# Patient Record
Sex: Male | Born: 1980
Health system: Southern US, Community
[De-identification: ages and names within clinical notes are randomized; demographics above are authoritative.]

## PROBLEM LIST (undated history)

## (undated) DIAGNOSIS — I10 Essential (primary) hypertension: Secondary | ICD-10-CM

## (undated) DIAGNOSIS — E785 Hyperlipidemia, unspecified: Secondary | ICD-10-CM

## (undated) HISTORY — PX: KNEE ARTHROSCOPY: SUR90

## (undated) HISTORY — DX: Essential (primary) hypertension: I10

## (undated) HISTORY — PX: KNEE SURGERY: SHX244

## (undated) HISTORY — DX: Hyperlipidemia, unspecified: E78.5

---

## 2020-01-16 DIAGNOSIS — E119 Type 2 diabetes mellitus without complications: Secondary | ICD-10-CM

## 2020-01-16 HISTORY — DX: Type 2 diabetes mellitus without complications: E11.9

## 2020-01-17 ENCOUNTER — Encounter (HOSPITAL_COMMUNITY): Payer: Self-pay

## 2020-01-17 ENCOUNTER — Other Ambulatory Visit: Payer: Self-pay

## 2020-01-17 ENCOUNTER — Inpatient Hospital Stay (HOSPITAL_COMMUNITY)
Admission: EM | Admit: 2020-01-17 | Discharge: 2020-01-20 | DRG: 638 | Disposition: A | Discharge status: Home / Self Care | Payer: Self-pay | Attending: Internal Medicine | Admitting: Internal Medicine

## 2020-01-17 ENCOUNTER — Ambulatory Visit (HOSPITAL_COMMUNITY)
Admission: EM | Admit: 2020-01-17 | Discharge: 2020-01-17 | Payer: Self-pay | Attending: Family Medicine | Admitting: Family Medicine

## 2020-01-17 ENCOUNTER — Encounter (HOSPITAL_COMMUNITY): Payer: Self-pay | Admitting: Emergency Medicine

## 2020-01-17 DIAGNOSIS — Z833 Family history of diabetes mellitus: Secondary | ICD-10-CM

## 2020-01-17 DIAGNOSIS — E871 Hypo-osmolality and hyponatremia: Secondary | ICD-10-CM | POA: Diagnosis present

## 2020-01-17 DIAGNOSIS — Z79899 Other long term (current) drug therapy: Secondary | ICD-10-CM

## 2020-01-17 DIAGNOSIS — E1110 Type 2 diabetes mellitus with ketoacidosis without coma: Principal | ICD-10-CM | POA: Diagnosis present

## 2020-01-17 DIAGNOSIS — Z8249 Family history of ischemic heart disease and other diseases of the circulatory system: Secondary | ICD-10-CM

## 2020-01-17 DIAGNOSIS — E861 Hypovolemia: Secondary | ICD-10-CM | POA: Diagnosis present

## 2020-01-17 DIAGNOSIS — Z6826 Body mass index (BMI) 26.0-26.9, adult: Secondary | ICD-10-CM

## 2020-01-17 DIAGNOSIS — E875 Hyperkalemia: Secondary | ICD-10-CM | POA: Diagnosis present

## 2020-01-17 DIAGNOSIS — R739 Hyperglycemia, unspecified: Secondary | ICD-10-CM

## 2020-01-17 DIAGNOSIS — R634 Abnormal weight loss: Secondary | ICD-10-CM | POA: Diagnosis present

## 2020-01-17 DIAGNOSIS — E876 Hypokalemia: Secondary | ICD-10-CM | POA: Diagnosis present

## 2020-01-17 DIAGNOSIS — N179 Acute kidney failure, unspecified: Secondary | ICD-10-CM | POA: Diagnosis present

## 2020-01-17 DIAGNOSIS — Z20822 Contact with and (suspected) exposure to covid-19: Secondary | ICD-10-CM | POA: Diagnosis present

## 2020-01-17 DIAGNOSIS — D696 Thrombocytopenia, unspecified: Secondary | ICD-10-CM | POA: Diagnosis present

## 2020-01-17 LAB — BASIC METABOLIC PANEL
Anion gap: 18 — ABNORMAL HIGH (ref 5–15)
Anion gap: 18 — ABNORMAL HIGH (ref 5–15)
Anion gap: 14 (ref 5–15)
BUN: 20 mg/dL (ref 6–20)
BUN: 19 mg/dL (ref 6–20)
BUN: 14 mg/dL (ref 6–20)
CO2: 17 mmol/L — ABNORMAL LOW (ref 22–32)
CO2: 18 mmol/L — ABNORMAL LOW (ref 22–32)
CO2: 21 mmol/L — ABNORMAL LOW (ref 22–32)
Calcium: 8.9 mg/dL (ref 8.9–10.3)
Calcium: 8.9 mg/dL (ref 8.9–10.3)
Calcium: 9.6 mg/dL (ref 8.9–10.3)
Chloride: 92 mmol/L — ABNORMAL LOW (ref 98–111)
Chloride: 96 mmol/L — ABNORMAL LOW (ref 98–111)
Chloride: 100 mmol/L (ref 98–111)
Creatinine, Ser: 1.3 mg/dL — ABNORMAL HIGH (ref 0.61–1.24)
Creatinine, Ser: 1.28 mg/dL — ABNORMAL HIGH (ref 0.61–1.24)
Creatinine, Ser: 1.13 mg/dL (ref 0.61–1.24)
GFR calc Af Amer: >60 mL/min (ref >60)
GFR calc Af Amer: >60 mL/min (ref >60)
GFR calc Af Amer: >60 mL/min (ref >60)
GFR calc non Af Amer: >60 mL/min (ref >60)
GFR calc non Af Amer: >60 mL/min (ref >60)
GFR calc non Af Amer: >60 mL/min (ref >60)
Glucose, Bld: 781 mg/dL (ref 70–99)
Glucose, Bld: 534 mg/dL (ref 70–99)
Glucose, Bld: 157 mg/dL — ABNORMAL HIGH (ref 70–99)
Potassium: 5.6 mmol/L — ABNORMAL HIGH (ref 3.5–5.1)
Potassium: 4.5 mmol/L (ref 3.5–5.1)
Potassium: 5.2 mmol/L — ABNORMAL HIGH (ref 3.5–5.1)
Sodium: 127 mmol/L — ABNORMAL LOW (ref 135–145)
Sodium: 132 mmol/L — ABNORMAL LOW (ref 135–145)
Sodium: 135 mmol/L (ref 135–145)

## 2020-01-17 LAB — POCT I-STAT EG7
Acid-base deficit: 4 mmol/L — ABNORMAL HIGH (ref 0.0–2.0)
Bicarbonate: 22.2 mmol/L (ref 20.0–28.0)
Calcium, Ion: 1.16 mmol/L (ref 1.15–1.40)
HCT: 37 % — ABNORMAL LOW (ref 39.0–52.0)
Hemoglobin: 12.6 g/dL — ABNORMAL LOW (ref 13.0–17.0)
O2 Saturation: 79 %
Potassium: 4.7 mmol/L (ref 3.5–5.1)
Sodium: 128 mmol/L — ABNORMAL LOW (ref 135–145)
TCO2: 23 mmol/L (ref 22–32)
pCO2, Ven: 41.6 mmHg — ABNORMAL LOW (ref 44.0–60.0)
pH, Ven: 7.335 (ref 7.250–7.430)
pO2, Ven: 46 mmHg — ABNORMAL HIGH (ref 32.0–45.0)

## 2020-01-17 LAB — URINALYSIS, ROUTINE W REFLEX MICROSCOPIC
Bacteria, UA: NONE SEEN
Bilirubin Urine: NEGATIVE
Glucose, UA: >=500 mg/dL — AB
Hgb urine dipstick: NEGATIVE
Ketones, ur: 20 mg/dL — AB
Leukocytes,Ua: NEGATIVE
Nitrite: NEGATIVE
Protein, ur: NEGATIVE mg/dL
Specific Gravity, Urine: 1.026 (ref 1.005–1.030)
pH: 6 (ref 5.0–8.0)

## 2020-01-17 LAB — CBC
HCT: 38.9 % — ABNORMAL LOW (ref 39.0–52.0)
Hemoglobin: 13.6 g/dL (ref 13.0–17.0)
MCH: 31.5 pg (ref 26.0–34.0)
MCHC: 35 g/dL (ref 30.0–36.0)
MCV: 90 fL (ref 80.0–100.0)
Platelets: 148 10*3/uL — ABNORMAL LOW (ref 150–400)
RBC: 4.32 MIL/uL (ref 4.22–5.81)
RDW: 13.1 % (ref 11.5–15.5)
WBC: 5.3 10*3/uL (ref 4.0–10.5)
nRBC: 0 % (ref 0.0–0.2)

## 2020-01-17 LAB — GLUCOSE, CAPILLARY
Glucose-Capillary: 249 mg/dL — ABNORMAL HIGH (ref 70–99)
Glucose-Capillary: 288 mg/dL — ABNORMAL HIGH (ref 70–99)
Glucose-Capillary: 290 mg/dL — ABNORMAL HIGH (ref 70–99)
Glucose-Capillary: 226 mg/dL — ABNORMAL HIGH (ref 70–99)

## 2020-01-17 LAB — HEMOGLOBIN A1C
Hgb A1c MFr Bld: 14.9 % — ABNORMAL HIGH (ref 4.8–5.6)
Mean Plasma Glucose: 380.93 mg/dL

## 2020-01-17 LAB — CBG MONITORING, ED
Glucose-Capillary: >600 mg/dL (ref 70–99)
Glucose-Capillary: >600 mg/dL (ref 70–99)
Glucose-Capillary: 589 mg/dL (ref 70–99)

## 2020-01-17 LAB — SARS CORONAVIRUS 2 BY RT PCR (HOSPITAL ORDER, PERFORMED IN ~~LOC~~ HOSPITAL LAB): SARS Coronavirus 2: NEGATIVE

## 2020-01-17 LAB — HIV ANTIBODY (ROUTINE TESTING W REFLEX): HIV Screen 4th Generation wRfx: NONREACTIVE

## 2020-01-17 LAB — BETA-HYDROXYBUTYRIC ACID
Beta-Hydroxybutyric Acid: 3.94 mmol/L — ABNORMAL HIGH (ref 0.05–0.27)
Beta-Hydroxybutyric Acid: 4.43 mmol/L — ABNORMAL HIGH (ref 0.05–0.27)

## 2020-01-17 MED ORDER — DEXTROSE 50 % IV SOLN: 0.0000 mL | INTRAVENOUS | Status: DC | PRN

## 2020-01-17 MED ORDER — SODIUM CHLORIDE 0.9 % IV SOLN: INTRAVENOUS | Status: DC

## 2020-01-17 MED ORDER — ENOXAPARIN SODIUM 40 MG/0.4ML ~~LOC~~ SOLN
40.0000 mg | SUBCUTANEOUS | Status: DC
  Administered 2020-01-17 – 2020-01-19 (×3): 40 mg via SUBCUTANEOUS
  Filled 2020-01-17 (×3): qty 0.4

## 2020-01-17 MED ORDER — DEXTROSE-NACL 5-0.45 % IV SOLN: INTRAVENOUS | Status: DC

## 2020-01-17 MED ORDER — SODIUM CHLORIDE 0.9 % IV BOLUS: 1000.0000 mL | INTRAVENOUS | Status: DC

## 2020-01-17 MED ORDER — INSULIN REGULAR(HUMAN) IN NACL 100-0.9 UT/100ML-% IV SOLN
INTRAVENOUS | Status: DC
  Administered 2020-01-17: 15 [IU]/h via INTRAVENOUS
  Filled 2020-01-17: qty 100

## 2020-01-17 MED ORDER — SODIUM CHLORIDE 0.9 % IV BOLUS: 1000.0000 mL | INTRAVENOUS | Status: AC

## 2020-01-17 MED ORDER — SODIUM CHLORIDE 0.9 % IV BOLUS
1000.0000 mL | Freq: Once | INTRAVENOUS | Status: AC
  Administered 2020-01-17: 1000 mL via INTRAVENOUS

## 2020-01-17 NOTE — Discharge Instructions (Addendum)
Your blood sugar was very high today.  You need to go to the ER for further evaluation and management of your blood sugars

## 2020-01-17 NOTE — ED Notes (Signed)
Pt reports for several weeks he has not felt well . Pt has had frequent urination and increased water intake with a dry mouth. Pt has had some dizziness and blurred vision. This is the first known high blood sugar . Pt reports one parent is diabetic.

## 2020-01-17 NOTE — ED Provider Notes (Signed)
[place]    CSN: [id number] Arrival date & time: 01/17/20  1231      History   Chief Complaint Chief Complaint  Patient presents with   dizziness, hypotension, losing weight    HPI Anthony Schmidt is a 39 y.o. male.   Patient is an overall well-nourished, healthy 39 year old male with new onset fatigue, dizziness, blurred vision, decreased appetite, weight loss, increased thirst, and increased urinary frequency beginning 2 weeks ago. 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 Symptoms persist despite increased PO fluid intake and rest. Denies fevers, headache, chest pain, shortness of breath, alterations in taste/smell, unilateral weakness, or syncopal episodes. Reports family history of diabetes.   ROS per HPI      History reviewed. No pertinent past medical history.  There are no problems to display for this patient.   Past Surgical History:  Procedure Laterality Date   KNEE ARTHROSCOPY Bilateral        Home Medications    Prior to Admission medications   Not on File    Family History Family History  Problem Relation Age of Onset   Diabetes Mother    Hypertension Mother    Diabetes Father    Hypertension Father     Social History Social History   Tobacco Use   Smoking status: Never Smoker   Smokeless tobacco: Never Used  Substance Use Topics   Alcohol use: Yes    Alcohol/week: 6.0 standard drinks    Types: 6 Cans of beer per week   Drug use: Never     Allergies   Patient has no known allergies.   Review of Systems Review of Systems   Physical Exam Triage Vital Signs ED Triage Vitals  Enc Vitals Group     BP 01/17/20 1259 (!) 137/94     Pulse Rate 01/17/20 1259 98     Resp 01/17/20 1259 16     Temp 01/17/20 1259 98 F (36.7 C)     Temp Source 01/17/20 1259 Oral     SpO2 01/17/20 1259 99 %   Weight 01/17/20 1302 216 lb 3.2 oz (98.1 kg)     Height 01/17/20 1302 6\' 3"  (1.905 m)     Head Circumference --      Peak Flow --      Pain Score 01/17/20 1302 0     Pain Loc --      Pain Edu? --      Excl. in [place]? --    No data found.  Updated Vital Signs BP (!) 137/94    Pulse 98    Temp 98 F (36.7 C) (Oral)    Resp 16    Ht 6\' 3"  (1.905 m)    Wt 216 lb 3.2 oz (98.1 kg)    SpO2 99%    BMI 27.02 kg/m   Visual Acuity Right Eye Distance:   Left Eye Distance:   Bilateral Distance:    Right Eye Near:   Left Eye Near:    Bilateral Near:     Physical Exam Vitals and nursing note reviewed.  Constitutional:      General: He is not in acute distress.    Appearance: Normal appearance. He is ill-appearing. He is not toxic-appearing or diaphoretic.  HENT:     Head: Normocephalic and atraumatic.     Nose: Nose normal.  Eyes:  Conjunctiva/sclera: Conjunctivae normal.  Cardiovascular:     Rate and Rhythm: Normal rate and regular rhythm.     Pulses: Normal pulses.     Heart sounds: Normal heart sounds.  Pulmonary:     Effort: Pulmonary effort is normal.  Musculoskeletal:        General: Normal range of motion.     Cervical back: Normal range of motion.  Skin:    General: Skin is warm and dry.  Neurological:     General: No focal deficit present.     Mental Status: He is alert.     Cranial Nerves: No cranial nerve deficit.     Motor: No weakness.     Gait: Gait normal.  Psychiatric:        Mood and Affect: Mood normal.      UC Treatments / Results  Labs (all labs ordered are listed, but only abnormal results are displayed) Labs Reviewed  CBG MONITORING, ED - Abnormal; Notable for the following components:      Result Value   Glucose-Capillary >600 (*)    All other components within normal limits    EKG   Radiology No results found.  Procedures Procedures (including critical care time)  Medications Ordered in UC Medications - No data to  display  Initial Impression / Assessment and Plan / UC Course  I have reviewed the triage vital signs and the nursing notes.  Pertinent labs & imaging results that were available during my care of the patient were reviewed by me and considered in my medical decision making (see chart for details).  Clinical Course as of Jan 17 1415  Wed Jan 17, 2020  1358 POC CBG monitoring [TB]    Clinical Course User Index [TB] Dahlia Byes A, NP    Hyperglycemia New onset diabetes with CBG greater than 600 Sending to the ER for further management.  Pt stable to go down by himself.  Final Clinical Impressions(s) / UC Diagnoses   Final diagnoses:  Hyperglycemia     Discharge Instructions     Your blood sugar was very high today.  You need to go to the ER for further evaluation and management of your blood sugars    ED Prescriptions    None     PDMP not reviewed this encounter.   Dahlia Byes A, NP 01/17/20 1416

## 2020-01-17 NOTE — ED Notes (Signed)
Patient is being discharged from the Urgent Care Center and sent to the Emergency Department via personal vehicle by self. Per Provider Dahlia Byes, patient is stable but in need of higher level of care due to extremely elevated blood sugar. Patient is aware and verbalizes understanding of plan of care.   Vitals:   01/17/20 1259  BP: (!) 137/94  Pulse: 98  Resp: 16  Temp: 98 F (36.7 C)  SpO2: 99%

## 2020-01-17 NOTE — ED Triage Notes (Signed)
Pt c/o dizziness, losing weight and hypotensionx1 mo. Pt states a friend had a bp and took it and it was low. Pt can't remember what the number was.

## 2020-01-17 NOTE — ED Provider Notes (Signed)
[facility] EMERGENCY DEPARTMENT Provider Note   CSN: [id number] Arrival date & time: 01/17/20  1624     History Chief Complaint  Patient presents with  . Hyperglycemia  . Dizziness    Anthony Schmidt is a 39 y.o. male who presents for evaluation of a month-long history of increased thirst, urinary frequency, excessive fatigue, intermittent dizziness/lightheadedness, weight loss, decreased appetite.  He reports about a month ago, he started noticing that he "did not feel quite right."  He reported feeling more tired and feeling like he did not have any energy.  He would have intermittent episodes of lightheadedness/dizziness.  This occurred mostly when he bent down.  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  He states that even after resting for 2 days, he never felt 100%.  He states he has noticed that he has had to have increased urinary frequency.  No dysuria.  He states that he is thirsty all the time and his mouth is very dry.  He reports that the last time he went to a doctor's office was about a year ago when he had his DOT physical.  He is not currently on any medications.  He denies any fever, chest pain, difficulty breathing, abdominal pain, nausea/vomiting.  The history is provided by the patient.       History reviewed. No pertinent past medical history.  Patient Active Problem List   Diagnosis Date Noted  . DKA (diabetic ketoacidoses) ([place]) 01/17/2020  . Hyperkalemia 01/17/2020    Past Surgical History:  Procedure Laterality Date  . KNEE ARTHROSCOPY Bilateral        Family History  Problem Relation Age of Onset  . Diabetes Mother   . Hypertension Mother   . Diabetes Father   . Hypertension Father     Social History   Tobacco Use  . Smoking status: Never Smoker  . Smokeless tobacco: Never Used  Substance Use Topics  .  Alcohol use: Yes    Alcohol/week: 6.0 standard drinks    Types: 6 Cans of beer per week  . Drug use: Never    Home Medications Prior to Admission medications   Medication Sig Start Date End Date Taking? Authorizing Provider  Multiple Vitamin (MULTIVITAMIN WITH MINERALS) TABS tablet Take 1 tablet by mouth daily.   Yes [provider]    Allergies    Patient has no known allergies.  Review of Systems   Review of Systems  Constitutional: Positive for activity change, appetite change, fatigue and unexpected weight change. Negative for fever.  Respiratory: Negative for cough and shortness of breath.   Cardiovascular: Negative for chest pain.  Gastrointestinal: Negative for abdominal pain, nausea and vomiting.  Endocrine: Positive for polydipsia, polyphagia and polyuria.  Genitourinary: Negative for dysuria and hematuria.  Neurological: Positive for light-headedness. Negative for headaches.  All other systems reviewed and are negative.   Physical Exam Updated Vital Signs BP 132/87   Pulse 83   Temp 98.4 F (36.9 C) (Oral)   Resp 16   SpO2 98%   Physical Exam Vitals and nursing note reviewed.  Constitutional:      Appearance: Normal appearance. He is well-developed.  HENT:     Head: Normocephalic and atraumatic.  Eyes:     General: Lids are normal.     Conjunctiva/sclera: Conjunctivae normal.  Pupils: Pupils are equal, round, and reactive to light.     Comments: PERRL. EOMs intact. No nystagmus. No neglect.   Cardiovascular:     Rate and Rhythm: Normal rate and regular rhythm.     Pulses: Normal pulses.     Heart sounds: Normal heart sounds. No murmur. No friction rub. No gallop.   Pulmonary:     Effort: Pulmonary effort is normal.     Breath sounds: Normal breath sounds.     Comments: Lungs clear to auscultation bilaterally.  Symmetric chest rise.  No wheezing, rales, rhonchi. Abdominal:     Palpations: Abdomen is soft. Abdomen is not rigid.      Tenderness: There is no abdominal tenderness. There is no guarding.     Comments: Abdomen is soft, non-distended, non-tender. No rigidity, No guarding. No peritoneal signs.  Musculoskeletal:        General: Normal range of motion.     Cervical back: Full passive range of motion without pain.  Skin:    General: Skin is warm and dry.     Capillary Refill: Capillary refill takes less than 2 seconds.  Neurological:     Mental Status: He is alert and oriented to person, place, and time.     Comments: Cranial nerves III-XII intact Follows commands, Moves all extremities  5/5 strength to BUE and BLE  Sensation intact throughout all major nerve distributions No slurred speech. No facial droop.   Psychiatric:        Speech: Speech normal.     ED Results / Procedures / Treatments   Labs (all labs ordered are listed, but only abnormal results are displayed) Labs Reviewed  BASIC METABOLIC PANEL - Abnormal; Notable for the following components:      Result Value   Sodium 127 (*)    Potassium 5.6 (*)    Chloride 92 (*)    CO2 17 (*)    Glucose, Bld 781 (*)    Creatinine, Ser 1.30 (*)    Anion gap 18 (*)    All other components within normal limits  CBC - Abnormal; Notable for the following components:   HCT 38.9 (*)    Platelets 148 (*)    All other components within normal limits  URINALYSIS, ROUTINE W REFLEX MICROSCOPIC - Abnormal; Notable for the following components:   Color, Urine COLORLESS (*)    Glucose, UA >=500 (*)    Ketones, ur 20 (*)    All other components within normal limits  BETA-HYDROXYBUTYRIC ACID - Abnormal; Notable for the following components:   Beta-Hydroxybutyric Acid 3.94 (*)    All other components within normal limits  CBG MONITORING, ED - Abnormal; Notable for the following components:   Glucose-Capillary >600 (*)    All other components within normal limits  CBG MONITORING, ED - Abnormal; Notable for the following components:   Glucose-Capillary 589 (*)     All other components within normal limits  POCT I-STAT EG7 - Abnormal; Notable for the following components:   pCO2, Ven 41.6 (*)    pO2, Ven 46.0 (*)    Acid-base deficit 4.0 (*)    Sodium 128 (*)    HCT 37.0 (*)    Hemoglobin 12.6 (*)    All other components within normal limits  SARS CORONAVIRUS 2 BY RT PCR (HOSPITAL ORDER, PERFORMED IN [facility])  BASIC METABOLIC PANEL  BASIC METABOLIC PANEL  BASIC METABOLIC PANEL  BASIC METABOLIC PANEL  BETA-HYDROXYBUTYRIC ACID  BETA-HYDROXYBUTYRIC ACID  CBG MONITORING, ED  I-STAT VENOUS BLOOD GAS, ED    EKG EKG Interpretation  Date/Time:  Wednesday January 17 2020 19:06:14 EDT Ventricular Rate:  82 PR Interval:    QRS Duration: 72 QT Interval:  355 QTC Calculation: 415 R Axis:   31 Text Interpretation: Sinus rhythm Abnormal R-wave progression, early transition nonspecific ST changes with no prior for comparison Baseline wander in lead(s) III No STEMI Confirmed by Alona Bene [id number]) on 01/17/2020 7:10:47 PM   Radiology No results found.  Procedures .Critical Care Performed by: Maxwell Caul, PA-C Authorized by: Maxwell Caul, PA-C   Critical care provider statement:    Critical care time (minutes):  45   Critical care was necessary to treat or prevent imminent or life-threatening deterioration of the following conditions:  Metabolic crisis   Critical care was time spent personally by me on the following activities:  Discussions with consultants, evaluation of patient's response to treatment, examination of patient, ordering and performing treatments and interventions, ordering and review of laboratory studies, ordering and review of radiographic studies, pulse oximetry, re-evaluation of patient's condition, obtaining history from patient or surrogate and review of old charts   (including critical care time)  Medications Ordered in ED Medications  insulin regular, human (MYXREDLIN) 100 units/ 100 mL  infusion (15 Units/hr Intravenous New Bag/Given 01/17/20 1924)  0.9 %  sodium chloride infusion (has no administration in time range)  dextrose 5 %-0.45 % sodium chloride infusion (has no administration in time range)  dextrose 50 % solution 0-50 mL (has no administration in time range)  sodium chloride 0.9 % bolus 1,000 mL (has no administration in time range)  sodium chloride 0.9 % bolus 1,000 mL (0 mLs Intravenous Stopped 01/17/20 1910)    ED Course  I have reviewed the triage vital signs and the nursing notes.  Pertinent labs & imaging results that were available during my care of the patient were reviewed by me and considered in my medical decision making (see chart for details).    MDM Rules/Calculators/A&P                       39 year old male who presents for evaluation of a month-long history of fatigue, lack of energy, increased urinary frequency, increased thirst.  Went to urgent care where he was found to be hyperglycemic and was sent to the ED for further evaluation.  He tells me the symptoms have been ongoing for about a month but felt like it got worse in the last week.  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  He does not take any daily medications.  No personal history of diabetes but states that both sides of his family have strong family history of diabetes.  On initially arrival, he is afebrile, nontoxic-appearing.  Vital signs are stable.  Currently denies any dizziness.  No neuro deficits noted on exam.  I doubt that this is CVA given the episodic nature of it as well as reassuring neuro exam.  I suspect this is more likely due to metabolic crisis such as DKA.  Plan for labs, fluids.  POC CBG greater than 600.  CBC shows no leukocytosis or anemia.  UA does show ketones and glucosuria.  BMP shows sodium of 127, potassium of 5.6, bicarb of 17.  Glucose is 781.  Anion gap is 18.  Concern for DKA.  Corrected sodium is 138.  Plan for insulin drip and  admission.  Discussed patient with Dr. Antionette Char who accepts patient for admission.   Portions of this note were generated with Scientist, clinical (histocompatibility and immunogenetics). Dictation errors may occur despite best attempts at proofreading.   Final Clinical Impression(s) / ED Diagnoses Final diagnoses:  Diabetic ketoacidosis without coma associated with type 2 diabetes mellitus [facility])    Rx / DC Orders ED Discharge Orders    None       Rosana Hoes 01/17/20 2009    Long, Joshua G, MD 01/20/20 [phone]

## 2020-01-17 NOTE — H&P (Signed)
History and Physical    Anthony Schmidt WUJ:[id number] DOB: 1981-04-15 DOA: 01/17/2020  PCP: Patient, No Pcp Per   Patient coming from: Home   Chief Complaint: Dry mouth, increased urination, fatigue, weight loss, blurred vision   HPI: Anthony Schmidt is a 39 y.o. male who denies any significant past medical history and now presents for evaluation of polyuria, polydipsia, weight loss, fatigue, and blurred vision.  Patient reports that he is generally healthy but approximately 1 month ago developed dry mouth with increased thirst, increased urination, fatigue, and blurred vision.  He has had an unintentional weight loss over the past 1 month as well.  He had never experienced these symptoms previously, does not take any medications or have any known medical problems, and does not have a PCP.  He denies any chest pain, headaches, fevers, or chills.  ED Course: Upon arrival to the ED, patient is found to be afebrile, saturating well on room air, slightly tachycardic, and with stable blood pressure.  EKG features sinus rhythm.  Chemistry panel with a glucose of 181, potassium 5.6, bicarbonate 17, anion gap 18, and creatinine 1.3.  CBC with slight thrombocytopenia.  Urinalysis with glucosuria and ketonuria.  Covid PCR screening test is negative.  Patient was given a liter of normal saline and started on insulin infusion in the ED.  Review of Systems:  All other systems reviewed and apart from HPI, are negative.  History reviewed. No pertinent past medical history.  Past Surgical History:  Procedure Laterality Date   KNEE ARTHROSCOPY Bilateral      reports that he has never smoked. He has never used smokeless tobacco. He reports current alcohol use of about 6.0 standard drinks of alcohol per week. He reports that he does not use drugs.  No Known Allergies  Family History  Problem Relation Age of Onset   Diabetes Mother    Hypertension Mother    Diabetes Father    Hypertension Father       Prior to Admission medications   Medication Sig Start Date End Date Taking? Authorizing Provider  Multiple Vitamin (MULTIVITAMIN WITH MINERALS) TABS tablet Take 1 tablet by mouth daily.   Yes [provider]    Physical Exam: Vitals:   01/17/20 1800 01/17/20 1815 01/17/20 1830 01/17/20 1900  BP: 129/86 128/86 127/84 (!) 131/92  Pulse: 85 88 87 81  Resp: 14 17 15    Temp:      TempSrc:      SpO2: 100% 99% 99% 99%    Constitutional: NAD, calm  Eyes: PERTLA, lids and conjunctivae normal ENMT: Mucous membranes are moist. Posterior pharynx clear of any exudate or lesions.   Neck: normal, supple, no masses, no thyromegaly Respiratory: clear to auscultation bilaterally, no wheezing, no crackles. No accessory muscle use.  Cardiovascular: S1 & S2 heard, regular rate and rhythm. No extremity edema.   Abdomen: No distension, no tenderness, soft. Bowel sounds active.  Musculoskeletal: no clubbing / cyanosis. No joint deformity upper and lower extremities.   Skin: no significant rashes, lesions, ulcers. Warm, dry, well-perfused. Neurologic: No facial asymmetry. Sensation intact. Moving all extremities.  Psychiatric: Alert and oriented to person, place, and situation. Pleasant and cooperative.    Labs and Imaging on Admission: I have personally reviewed following labs and imaging studies  CBC: Recent Labs  Lab 01/17/20 1642 01/17/20 1849  WBC 5.3  --   HGB 13.6 12.6*  HCT 38.9* 37.0*  MCV 90.0  --   PLT 148*  --  Basic Metabolic Panel: Recent Labs  Lab 01/17/20 1642 01/17/20 1849  NA 127* 128*  K 5.6* 4.7  CL 92*  --   CO2 17*  --   GLUCOSE 781*  --   BUN 20  --   CREATININE 1.30*  --   CALCIUM 8.9  --    GFR: Estimated Creatinine Clearance: 90.3 mL/min (A) (by C-G formula based on SCr of 1.3 mg/dL (H)). Liver Function Tests: No results for input(s): AST, ALT, ALKPHOS, BILITOT, PROT, ALBUMIN in the last 168 hours. No results for input(s): LIPASE,  AMYLASE in the last 168 hours. No results for input(s): AMMONIA in the last 168 hours. Coagulation Profile: No results for input(s): INR, PROTIME in the last 168 hours. Cardiac Enzymes: No results for input(s): CKTOTAL, CKMB, CKMBINDEX, TROPONINI in the last 168 hours. BNP (last 3 results) No results for input(s): PROBNP in the last 8760 hours. HbA1C: No results for input(s): HGBA1C in the last 72 hours. CBG: Recent Labs  Lab 01/17/20 1409 01/17/20 1630 01/17/20 1918  GLUCAP >600* >600* 589*   Lipid Profile: No results for input(s): CHOL, HDL, LDLCALC, TRIG, CHOLHDL, LDLDIRECT in the last 72 hours. Thyroid Function Tests: No results for input(s): TSH, T4TOTAL, FREET4, T3FREE, THYROIDAB in the last 72 hours. Anemia Panel: No results for input(s): VITAMINB12, FOLATE, FERRITIN, TIBC, IRON, RETICCTPCT in the last 72 hours. Urine analysis:    Component Value Date/Time   COLORURINE COLORLESS (A) 01/17/2020 1633   APPEARANCEUR CLEAR 01/17/2020 1633   LABSPEC 1.026 01/17/2020 1633   PHURINE 6.0 01/17/2020 1633   GLUCOSEU >=500 (A) 01/17/2020 1633   HGBUR NEGATIVE 01/17/2020 1633   BILIRUBINUR NEGATIVE 01/17/2020 1633   KETONESUR 20 (A) 01/17/2020 1633   PROTEINUR NEGATIVE 01/17/2020 1633   NITRITE NEGATIVE 01/17/2020 1633   LEUKOCYTESUR NEGATIVE 01/17/2020 1633   Sepsis Labs: @LABRCNTIP (procalcitonin:4,lacticidven:4) ) Recent Results (from the past 240 hour(s))  SARS Coronavirus 2 by RT PCR (hospital order, performed in [facility] hospital lab) Nasopharyngeal Nasopharyngeal Swab     Status: None   Collection Time: 01/17/20  6:00 PM   Specimen: Nasopharyngeal Swab  Result Value Ref Range Status   SARS Coronavirus 2 NEGATIVE NEGATIVE Final    Comment: (NOTE) SARS-CoV-2 target nucleic acids are NOT DETECTED. The SARS-CoV-2 RNA is generally detectable in upper and lower respiratory specimens during the acute phase of infection. The lowest concentration of SARS-CoV-2 viral  copies this assay can detect is 250 copies / mL. A negative result does not preclude SARS-CoV-2 infection and should not be used as the sole basis for treatment or other patient management decisions.  A negative result may occur with improper specimen collection / handling, submission of specimen other than nasopharyngeal swab, presence of viral mutation(s) within the areas targeted by this assay, and inadequate number of viral copies (<250 copies / mL). A negative result must be combined with clinical observations, patient history, and epidemiological information. Fact Sheet for Patients:   03/18/20 Fact Sheet for Healthcare Providers: BoilerBrush.com.cy This test is not yet approved or cleared  by the https://pope.com/ FDA and has been authorized for detection and/or diagnosis of SARS-CoV-2 by FDA under an Emergency Use Authorization (EUA).  This EUA will remain in effect (meaning this test can be used) for the duration of the COVID-19 declaration under Section 564(b)(1) of the Act, 21 U.S.C. section 360bbb-3(b)(1), unless the authorization is terminated or revoked sooner. Performed at [facility], [address] [place], [place]  Radiological Exams on Admission: No results found.  EKG: Independently reviewed. Sinus rhythm.   Assessment/Plan  1. DKA; new-onset DM  - Presents with one month of polyuria, polydipsia, fatigue, wt loss, and blurred vision, and is found to have serum glucose 781 with metabolic acidosis and urine ketones  - He was started on IVF and insulin infusion in ED  - Continue IVF, continue insulin infusion with frequent CBGs and serial chem panels, check A1c, and consult with diabetes coordinator    2. Hyperkalemia  - Potassium is 5.6 in ED without EKG changes   - Anticipate resolution with continued IVF and insulin, will continue cardiac monitoring and serial chem panels     DVT  prophylaxis: Lovenox  Code Status: Full  Family Communication: Discussed with patient  Disposition Plan:  Patient is from: Home  Anticipated d/c is to: Home  Anticipated d/c date is: 01/19/20 Patient currently: Requiring insulin infusion and IV fluid hydration  Consults called: None  Admission status: Inpatient     Briscoe Deutscher, MD [facility] Pager: See [email or web address]  If 7AM-7PM, please contact the daytime attending [email or web address]  01/17/2020, 7:40 PM

## 2020-01-17 NOTE — ED Notes (Signed)
CBG > 600 reported to Selena Batten Lapan-Hutchen RN and Dahlia Byes NP.

## 2020-01-17 NOTE — ED Notes (Signed)
Report attempted 

## 2020-01-17 NOTE — ED Triage Notes (Signed)
Patient arrives to ED with complaints of dizziness, hyperglycemia (>600 at UC today), extreme thirst, and urinary frequency for the last month.

## 2020-01-18 DIAGNOSIS — E1110 Type 2 diabetes mellitus with ketoacidosis without coma: Principal | ICD-10-CM

## 2020-01-18 LAB — URINALYSIS, ROUTINE W REFLEX MICROSCOPIC
Bilirubin Urine: NEGATIVE
Glucose, UA: >=500 mg/dL — AB
Hgb urine dipstick: NEGATIVE
Ketones, ur: 20 mg/dL — AB
Leukocytes,Ua: NEGATIVE
Nitrite: NEGATIVE
Protein, ur: NEGATIVE mg/dL
Specific Gravity, Urine: 1.027 (ref 1.005–1.030)
pH: 6 (ref 5.0–8.0)

## 2020-01-18 LAB — BASIC METABOLIC PANEL
Anion gap: 12 (ref 5–15)
Anion gap: 11 (ref 5–15)
BUN: 11 mg/dL (ref 6–20)
BUN: 9 mg/dL (ref 6–20)
CO2: 18 mmol/L — ABNORMAL LOW (ref 22–32)
CO2: 22 mmol/L (ref 22–32)
Calcium: 8.5 mg/dL — ABNORMAL LOW (ref 8.9–10.3)
Calcium: 8.8 mg/dL — ABNORMAL LOW (ref 8.9–10.3)
Chloride: 103 mmol/L (ref 98–111)
Chloride: 101 mmol/L (ref 98–111)
Creatinine, Ser: 0.96 mg/dL (ref 0.61–1.24)
Creatinine, Ser: 0.67 mg/dL (ref 0.61–1.24)
GFR calc Af Amer: >60 mL/min (ref >60)
GFR calc Af Amer: >60 mL/min (ref >60)
GFR calc non Af Amer: >60 mL/min (ref >60)
GFR calc non Af Amer: >60 mL/min (ref >60)
Glucose, Bld: 200 mg/dL — ABNORMAL HIGH (ref 70–99)
Glucose, Bld: 168 mg/dL — ABNORMAL HIGH (ref 70–99)
Potassium: 3.1 mmol/L — ABNORMAL LOW (ref 3.5–5.1)
Potassium: 3 mmol/L — ABNORMAL LOW (ref 3.5–5.1)
Sodium: 133 mmol/L — ABNORMAL LOW (ref 135–145)
Sodium: 134 mmol/L — ABNORMAL LOW (ref 135–145)

## 2020-01-18 LAB — GLUCOSE, CAPILLARY
Glucose-Capillary: 148 mg/dL — ABNORMAL HIGH (ref 70–99)
Glucose-Capillary: 151 mg/dL — ABNORMAL HIGH (ref 70–99)
Glucose-Capillary: 153 mg/dL — ABNORMAL HIGH (ref 70–99)
Glucose-Capillary: 182 mg/dL — ABNORMAL HIGH (ref 70–99)
Glucose-Capillary: 183 mg/dL — ABNORMAL HIGH (ref 70–99)
Glucose-Capillary: 187 mg/dL — ABNORMAL HIGH (ref 70–99)
Glucose-Capillary: 194 mg/dL — ABNORMAL HIGH (ref 70–99)
Glucose-Capillary: 225 mg/dL — ABNORMAL HIGH (ref 70–99)
Glucose-Capillary: 235 mg/dL — ABNORMAL HIGH (ref 70–99)
Glucose-Capillary: 282 mg/dL — ABNORMAL HIGH (ref 70–99)
Glucose-Capillary: 358 mg/dL — ABNORMAL HIGH (ref 70–99)

## 2020-01-18 LAB — BETA-HYDROXYBUTYRIC ACID
Beta-Hydroxybutyric Acid: 2.97 mmol/L — ABNORMAL HIGH (ref 0.05–0.27)
Beta-Hydroxybutyric Acid: 2.06 mmol/L — ABNORMAL HIGH (ref 0.05–0.27)

## 2020-01-18 MED ORDER — INSULIN ASPART 100 UNIT/ML ~~LOC~~ SOLN
3.0000 [IU] | Freq: Three times a day (TID) | SUBCUTANEOUS | Status: DC
  Administered 2020-01-18: 3 [IU] via SUBCUTANEOUS

## 2020-01-18 MED ORDER — INSULIN ASPART 100 UNIT/ML ~~LOC~~ SOLN: 2.0000 [IU] | Freq: Three times a day (TID) | SUBCUTANEOUS | Status: DC

## 2020-01-18 MED ORDER — INSULIN ASPART 100 UNIT/ML ~~LOC~~ SOLN
0.0000 [IU] | Freq: Every day | SUBCUTANEOUS | Status: DC
  Administered 2020-01-18 – 2020-01-19 (×2): 4 [IU] via SUBCUTANEOUS

## 2020-01-18 MED ORDER — POTASSIUM CHLORIDE CRYS ER 20 MEQ PO TBCR
40.0000 meq | EXTENDED_RELEASE_TABLET | Freq: Two times a day (BID) | ORAL | Status: AC
  Administered 2020-01-18 (×2): 40 meq via ORAL
  Filled 2020-01-18 (×2): qty 2

## 2020-01-18 MED ORDER — INSULIN REGULAR(HUMAN) IN NACL 100-0.9 UT/100ML-% IV SOLN: INTRAVENOUS | Status: DC

## 2020-01-18 MED ORDER — SODIUM CHLORIDE 0.9 % IV SOLN: INTRAVENOUS | Status: DC

## 2020-01-18 MED ORDER — INSULIN ASPART 100 UNIT/ML ~~LOC~~ SOLN
0.0000 [IU] | Freq: Three times a day (TID) | SUBCUTANEOUS | Status: DC
  Administered 2020-01-18: 5 [IU] via SUBCUTANEOUS
  Administered 2020-01-18: 8 [IU] via SUBCUTANEOUS
  Administered 2020-01-19: 11 [IU] via SUBCUTANEOUS
  Administered 2020-01-19 – 2020-01-20 (×3): 5 [IU] via SUBCUTANEOUS
  Administered 2020-01-20: 8 [IU] via SUBCUTANEOUS

## 2020-01-18 MED ORDER — INSULIN GLARGINE 100 UNIT/ML ~~LOC~~ SOLN
20.0000 [IU] | Freq: Every day | SUBCUTANEOUS | Status: DC
  Filled 2020-01-18: qty 0.2

## 2020-01-18 MED ORDER — ACETAMINOPHEN 325 MG PO TABS
650.0000 mg | ORAL_TABLET | Freq: Four times a day (QID) | ORAL | Status: DC | PRN
  Administered 2020-01-18: 650 mg via ORAL
  Filled 2020-01-18: qty 2

## 2020-01-18 MED ORDER — INSULIN GLARGINE 100 UNIT/ML ~~LOC~~ SOLN
25.0000 [IU] | Freq: Every day | SUBCUTANEOUS | Status: DC
  Administered 2020-01-18: 25 [IU] via SUBCUTANEOUS
  Filled 2020-01-18 (×2): qty 0.25

## 2020-01-18 MED ORDER — SODIUM CHLORIDE 0.9 % IV BOLUS
500.0000 mL | Freq: Once | INTRAVENOUS | Status: AC
  Administered 2020-01-18: 500 mL via INTRAVENOUS

## 2020-01-18 MED ORDER — INSULIN STARTER KIT- PEN NEEDLES (ENGLISH)
1.0000 | Freq: Once | Status: DC
  Filled 2020-01-18: qty 1

## 2020-01-18 MED ORDER — INSULIN ASPART 100 UNIT/ML ~~LOC~~ SOLN
4.0000 [IU] | Freq: Three times a day (TID) | SUBCUTANEOUS | Status: DC
  Administered 2020-01-18 – 2020-01-19 (×2): 4 [IU] via SUBCUTANEOUS

## 2020-01-18 MED ORDER — LIVING WELL WITH DIABETES BOOK
Freq: Once | Status: AC
  Filled 2020-01-18: qty 1

## 2020-01-18 NOTE — Plan of Care (Signed)
  RD consulted for nutrition education regarding diabetes.   Lab Results  Component Value Date   HGBA1C 14.9 (H) 01/17/2020    RD provided "Carbohydrate Counting for People with Diabetes" handout from the Academy of Nutrition and Dietetics. Discussed different food groups and their effects on blood sugar, emphasizing carbohydrate-containing foods. Provided list of carbohydrates and recommended serving sizes of common foods.  Discussed importance of controlled and consistent carbohydrate intake throughout the day. Provided examples of ways to balance meals/snacks and encouraged intake of high-fiber, whole grain complex carbohydrates. Teach back method used.  Explained A1C and reviewed current results. Pt endorses eating one meal daily that consist of a protein, vegetable, and grain. Drinks juice, powerade, and water. Discussed importance of eating three meals daily, how to make a balanced plate, which foods contain carbohydrates, how to read a food label, and how to make appropriate beverage substitutions.   Expect great compliance.  Labs and medications reviewed. No further nutrition interventions warranted at this time. RD contact information provided. If additional nutrition issues arise, please re-consult RD.  Vanessa Kick RD, LDN Clinical Nutrition Pager listed in AMION

## 2020-01-18 NOTE — Progress Notes (Signed)
PROGRESS NOTE    Anthony Schmidt  XBJ:[id number] DOB: 12/29/1980 DOA: 01/17/2020 PCP: Patient, No Pcp Per   Brief Narrative: 39 year old with no significant past medical history who presents for further evaluation of polyuria, polydipsia weight loss fatigue and blurry vision.  Patient reports symptoms started approximately 1 month prior to admission.  Evaluation in the ED patient was found to be in DKA anion gap 18, bicarb 17 blood sugar 780, UA with ketonuria.    Assessment & Plan:   Principal Problem:   DKA (diabetic ketoacidoses) ([place]) Active Problems:   Hyperkalemia   1-DKA;  Present with anion gap metabolic acidosis, CBG 308, gap 18, bicarb 17 Treated with IV fluids, Insulin Gtt.  Transition today to Lantus 25 units, SSI, meals coverage.  Monitor today to determine appropriate insulin home regimen.  Diabetes educator consulted.  Check UA.  HbA1c at 14.   2-Hyperkalemia; secondary to DKA. Resolved.  Hypokalemia; replete with 40 meq times 2.   AKI; related to hypovolemia, secondary to DKA. Cr up to 1.3. Improved with IV fluids.   Hyponatremia; pseudohyponatremia. Improved with IV fluids.  Anion Gap metabolic acidosis; secondary to DKA; resolved.   Estimated body mass index is 26.31 kg/m as calculated from the following:   Height as of this encounter: 6\' 3"  (1.905 m).   Weight as of this encounter: 95.5 kg.   DVT prophylaxis: Lovenox Code Status: Full code Family Communication: Care discussed with patient.  Disposition Plan:  Status is: Inpatient  Remains inpatient appropriate because:Persistent severe electrolyte disturbances   Dispo: The patient is from: Home              Anticipated d/c is to: Home              Anticipated d/c date is: 1 day              Patient currently is not medically stable to d/c.         Consultants:   none  Procedures:   none  Antimicrobials:    Subjective: Sleepy, couldn't sleep last night. He has family history  of DM.  Denies chest pain, cough, dyspnea, dysuria.   Objective: Vitals:   01/17/20 2020 01/17/20 2327 01/18/20 0400 01/18/20 0818  BP: (!) 135/97 115/76 121/78 139/84  Pulse: 98 88 79 76  Resp: 16 16 13 16   Temp: 97.7 F (36.5 C) 97.8 F (36.6 C) 97.6 F (36.4 C) 97.6 F (36.4 C)  TempSrc: Oral Oral Oral Oral  SpO2: 100% 96% 96% 98%  Weight: 95.5 kg     Height: 6\' 3"  (1.905 m)       Intake/Output Summary (Last 24 hours) at 01/18/2020 0854 Last data filed at 01/18/2020 0400 Gross per 24 hour  Intake 937.63 ml  Output 400 ml  Net 537.63 ml   Filed Weights   01/17/20 2020  Weight: 95.5 kg    Examination:  General exam: Appears calm and comfortable  Respiratory system: Clear to auscultation. Respiratory effort normal. Cardiovascular system: S1 & S2 heard, RRR. No JVD, murmurs, rubs, gallops or clicks. No pedal edema. Gastrointestinal system: Abdomen is nondistended, soft and nontender. No organomegaly or masses felt. Normal bowel sounds heard. Central nervous system: Alert and oriented. No focal neurological deficits. Extremities: Symmetric 5 x 5 power. Skin: No rashes, lesions or ulcers    Data Reviewed: I have personally reviewed following labs and imaging studies  CBC: Recent Labs  Lab 01/17/20 1642 01/17/20 1849  WBC 5.3  --  HGB 13.6 12.6*  HCT 38.9* 37.0*  MCV 90.0  --   PLT 148*  --    Basic Metabolic Panel: Recent Labs  Lab 01/17/20 1642 01/17/20 1642 01/17/20 1849 01/17/20 1947 01/17/20 2112 01/18/20 0310 01/18/20 0720  NA 127*   < > 128* 132* 135 133* 134*  K 5.6*   < > 4.7 4.5 5.2* 3.1* 3.0*  CL 92*  --   --  96* 100 103 101  CO2 17*  --   --  18* 21* 18* 22  GLUCOSE 781*  --   --  534* 157* 200* 168*  BUN 20  --   --  19 14 11 9   CREATININE 1.30*  --   --  1.28* 1.13 0.96 0.67  CALCIUM 8.9  --   --  8.9 9.6 8.5* 8.8*   < > = values in this interval not displayed.   GFR: Estimated Creatinine Clearance: 146.7 mL/min (by C-G formula  based on SCr of 0.67 mg/dL). Liver Function Tests: No results for input(s): AST, ALT, ALKPHOS, BILITOT, PROT, ALBUMIN in the last 168 hours. No results for input(s): LIPASE, AMYLASE in the last 168 hours. No results for input(s): AMMONIA in the last 168 hours. Coagulation Profile: No results for input(s): INR, PROTIME in the last 168 hours. Cardiac Enzymes: No results for input(s): CKTOTAL, CKMB, CKMBINDEX, TROPONINI in the last 168 hours. BNP (last 3 results) No results for input(s): PROBNP in the last 8760 hours. HbA1C: Recent Labs    01/17/20 2112  HGBA1C 14.9*   CBG: Recent Labs  Lab 01/18/20 0357 01/18/20 0505 01/18/20 0609 01/18/20 0730 01/18/20 0852  GLUCAP 183* 148* 187* 151* 225*   Lipid Profile: No results for input(s): CHOL, HDL, LDLCALC, TRIG, CHOLHDL, LDLDIRECT in the last 72 hours. Thyroid Function Tests: No results for input(s): TSH, T4TOTAL, FREET4, T3FREE, THYROIDAB in the last 72 hours. Anemia Panel: No results for input(s): VITAMINB12, FOLATE, FERRITIN, TIBC, IRON, RETICCTPCT in the last 72 hours. Sepsis Labs: No results for input(s): PROCALCITON, LATICACIDVEN in the last 168 hours.  Recent Results (from the past 240 hour(s))  SARS Coronavirus 2 by RT PCR (hospital order, performed in [facility] hospital lab) Nasopharyngeal Nasopharyngeal Swab     Status: None   Collection Time: 01/17/20  6:00 PM   Specimen: Nasopharyngeal Swab  Result Value Ref Range Status   SARS Coronavirus 2 NEGATIVE NEGATIVE Final    Comment: (NOTE) SARS-CoV-2 target nucleic acids are NOT DETECTED. The SARS-CoV-2 RNA is generally detectable in upper and lower respiratory specimens during the acute phase of infection. The lowest concentration of SARS-CoV-2 viral copies this assay can detect is 250 copies / mL. A negative result does not preclude SARS-CoV-2 infection and should not be used as the sole basis for treatment or other patient management decisions.  A negative result  may occur with improper specimen collection / handling, submission of specimen other than nasopharyngeal swab, presence of viral mutation(s) within the areas targeted by this assay, and inadequate number of viral copies (<250 copies / mL). A negative result must be combined with clinical observations, patient history, and epidemiological information. Fact Sheet for Patients:   03/18/20 Fact Sheet for Healthcare Providers: BoilerBrush.com.cy This test is not yet approved or cleared  by the https://pope.com/ FDA and has been authorized for detection and/or diagnosis of SARS-CoV-2 by FDA under an Emergency Use Authorization (EUA).  This EUA will remain in effect (meaning this test can be used) for the duration  of the COVID-19 declaration under Section 564(b)(1) of the Act, 21 U.S.C. section 360bbb-3(b)(1), unless the authorization is terminated or revoked sooner. Performed at [facility], [address], [place], [place] [address]          Radiology Studies: No results found.      Scheduled Meds:  enoxaparin (LOVENOX) injection  40 mg Subcutaneous Q24H   insulin aspart  0-15 Units Subcutaneous TID WC   insulin aspart  0-5 Units Subcutaneous QHS   insulin aspart  2 Units Subcutaneous TID WC   insulin glargine  20 Units Subcutaneous Daily   potassium chloride  40 mEq Oral BID   Continuous Infusions:  sodium chloride     dextrose 5 % and 0.45% NaCl 75 mL/hr at 01/18/20 0400   insulin       LOS: 1 day    Time spent: 35 minutes.     Alba Cory, MD [facility]   If 7PM-7AM, please contact night-coverage [email or web address]  01/18/2020, 8:54 AM

## 2020-01-18 NOTE — Progress Notes (Addendum)
Inpatient Diabetes Program Recommendations  AACE/ADA: New Consensus Statement on Inpatient Glycemic Control   Target Ranges:  Prepandial:   less than 140 mg/dL      Peak postprandial:   less than 180 mg/dL (1-2 hours)      Critically ill patients:  140 - 180 mg/dL   Results for Anthony Schmidt, Anthony Schmidt (MRN [id number]) as of 01/18/2020 09:34  Ref. Range 01/18/2020 00:21 01/18/2020 01:30 01/18/2020 02:46 01/18/2020 03:57 01/18/2020 05:05 01/18/2020 06:09 01/18/2020 07:30 01/18/2020 08:52  Glucose-Capillary Latest Ref Range: 70 - 99 mg/dL 153 (H) 182 (H) 194 (H) 183 (H) 148 (H) 187 (H) 151 (H) 225 (H)  Results for Anthony Schmidt, Anthony Schmidt (MRN [id number]) as of 01/18/2020 09:34  Ref. Range 01/17/2020 16:42  Glucose Latest Ref Range: 70 - 99 mg/dL [facility])  Results for Anthony Schmidt, Anthony Schmidt (MRN [id number]) as of 01/18/2020 09:34  Ref. Range 01/17/2020 18:34 01/17/2020 19:47 01/18/2020 03:10  Beta-Hydroxybutyric Acid Latest Ref Range: 0.05 - 0.27 mmol/L 3.94 (H) 4.43 (H) 2.97 (H)  Results for Anthony Schmidt, Anthony Schmidt (MRN [id number]) as of 01/18/2020 09:34  Ref. Range 01/17/2020 21:12  Hemoglobin A1C Latest Ref Range: 4.8 - 5.6 % 14.9 (H)   Review of Glycemic Control  Diabetes history: NO Outpatient Diabetes medications: NA Current orders for Inpatient glycemic control: IV insulin; Lantus 25 units daily, Novolog 3 units TID with meals, Novolog 0-15 units TID with meals, Novolog 0-5 units QHS  Inpatient Diabetes Program Recommendations:    HbgA1C: A1C 14.9% on 01/17/2020 indicating an average glucose of 381 mg/dl over the past 2-3 months.  Labs: May want to consider ordering a c-peptide.  NOTE: Per chart, patient does not have a hx of DM but has a noted family hx of DM. Per H&P, patient reports symptoms of hyperglycemia x 1 month. Initial glucose 781 mg/dl on 01/17/2020 and patient was started on IV insulin for DKA.  Per labs at 7:20 am today, CO2 22, AG 11; labs at 3:10 am today with beta-hydroxybutyric acid 2.97. MD may want to recheck beta-hydroxybutyric acid  lab again before transitioning off IV insulin to SQ insulin to ensure acidosis is completely resolved (communicated to attending MD).  No insurance or PCP noted. Ordered Living Well with DM book, insulin starter kit, patient education by bedside RNs, and RD consult. TOC consult already ordered.   Spoke with patient about new diabetes diagnosis. Patient states that he does not have any insurance or PCP and that he has not seen a provider in many years (over 10 years). 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  Discussed initial glucose of 781 mg/dl on 01/17/20 and discussed A1C results (14.9% on 01/17/20) and explained what an A1C is and informed patient that his current A1C indicates an average glucose of 381 mg/dl over the past 2-3 months. Discussed basic pathophysiology of DM Type 1 and Type 2, basic home care, importance of checking CBGs and maintaining good CBG control to prevent long-term and short-term complications. Reviewed glucose and A1C goals.  Reviewed signs and symptoms of hyperglycemia and hypoglycemia along with treatment for both. Discussed impact of nutrition, exercise, stress, sickness, and medications on diabetes control. Discussed carb modified diet and informed patient that RD should be talking with him as well to further discuss Carb Modified diet.  Informed patient that he should be receiving a Living Well with diabetes booklet and encouraged patient to read through entire book once received. Discussed  basic pathophysiology of DKA and transition to currently ordered insulins (Lantus and Novolog). Explained that given how high his initial glucose was and how elevated his A1C is, he will likely be discharged on insulin.  Explained that since he will paying for insulin out of pocket, it would be recommended that he be prescribed Novolin insulin(s) since it is more affordable. Discussed NPH, Regular, and 70/30 insulin and  how they work.  Informed patient that Novolin insulins can be purchased at [facility] for $25 per vial or $43 per box of 5 insulin pens. Provided patient with handout information on Reli-On products and provided patient with a Reli-On glucometer and testing supplies.  Asked patient to check his glucose 4 times per day (before meals and at bedtime) and to keep a log book of glucose readings and insulin taken. Explained how the doctor he follows up with can use the log book to continue to make insulin adjustments if needed. Reviewed and demonstrated how to draw up and administer insulin with vial and syringe and how to use an insulin pen. Patient states that he would prefer to use insulin pens.  Patient was able to successfully demonstrate how to use an insulin pen.   Informed patient that RN will be asking him to self-administer insulin to ensure proper technique and ability to administer self insulin shots.   Patient verbalized understanding of information discussed and he states that he has no further questions at this time related to diabetes.   RNs to provide ongoing basic DM education at bedside with this patient and engage patient to actively check blood glucose and administer insulin injections. At time of discharge, would recommend switching patient to affordable Novolin insulin and prescribing Novolin 70/30 Flexpen (#[id number]) and insulin pen needles (#[id number]).  Thanks, Barnie Alderman, RN, MSN, CDE Diabetes Coordinator Inpatient Diabetes Program [phone] (Team Pager from 8am to 5pm)

## 2020-01-18 NOTE — TOC Initial Note (Signed)
Transition of Care [facility]) - Initial/Assessment Note    Patient Details  Name: Anthony Schmidt MRN: [id number] Date of Birth: 10-29-1980  Transition of [facility]) CM/SW Contact:    Lockie Pares, RN Phone Number: 01/18/2020, 1:47 PM  Clinical Narrative:                 Patient admitted with new onset DM DKA. BS 731. Was on insulin drip, now on SSI on carb modified diet. Has been inundated with information. Does not have PCP. Owns own trucking company. Has history of diabetes in family.  Will refer to [facility], get him an initial appointment, assist him with medicines until he sees [facility], Discussed good rx as well and signing up when he can for insurance.   Expected Discharge Plan: Home/Self Care Barriers to Discharge: Inadequate or no insurance(No PCP)   Patient Goals and CMS Choice        Expected Discharge Plan and Services Expected Discharge Plan: Home/Self Care   Discharge Planning Services: CM Consult   Living arrangements for the past 2 months: Single Family Home                                      Prior Living Arrangements/Services Living arrangements for the past 2 months: Single Family Home   Patient language and need for interpreter reviewed:: Yes Do you feel safe going back to the place where you live?: Yes      Need for Family Participation in Patient Care: Yes (Comment) Care giver support system in place?: Yes (comment)   Criminal Activity/Legal Involvement Pertinent to Current Situation/Hospitalization: No - Comment as needed  Activities of Daily Living Home Assistive Devices/Equipment: None ADL Screening (condition at time of admission) Patient's cognitive ability adequate to safely complete daily activities?: Yes Is the patient deaf or have difficulty hearing?: No Does the patient have difficulty seeing, even when wearing glasses/contacts?: No Does the patient have difficulty concentrating,  remembering, or making decisions?: No Patient able to express need for assistance with ADLs?: Yes Does the patient have difficulty dressing or bathing?: No Independently performs ADLs?: Yes (appropriate for developmental age) Does the patient have difficulty walking or climbing stairs?: No Weakness of Legs: None Weakness of Arms/Hands: None  Permission Sought/Granted Permission sought to share information with : Case Manager                Emotional Assessment Appearance:: Appears stated age Attitude/Demeanor/Rapport: Gracious Affect (typically observed): Stable, Pleasant Orientation: : Oriented to Self, Oriented to Place, Oriented to  Time, Oriented to Situation   Psych Involvement: No (comment)  Admission diagnosis:  DKA (diabetic ketoacidoses) ([facility]) [E11.10] Diabetic ketoacidosis without coma associated with type 2 diabetes mellitus ([facility]) [E11.10] Patient Active Problem List   Diagnosis Date Noted  . DKA (diabetic ketoacidoses) ([facility]) 01/17/2020  . Hyperkalemia 01/17/2020   PCP:  Patient, No Pcp Per Pharmacy:   [id number] - [place], [place] - [address] [facility] [address] [place] [address] Phone: [phone] Fax: [phone]     Social Determinants of Health (SDOH) Interventions    Readmission Risk Interventions No flowsheet data found.

## 2020-01-18 NOTE — Plan of Care (Signed)
Continue to monitor

## 2020-01-19 ENCOUNTER — Encounter (HOSPITAL_COMMUNITY): Payer: Self-pay | Admitting: Family Medicine

## 2020-01-19 LAB — BASIC METABOLIC PANEL
Anion gap: 13 (ref 5–15)
BUN: 5 mg/dL — ABNORMAL LOW (ref 6–20)
CO2: 19 mmol/L — ABNORMAL LOW (ref 22–32)
Calcium: 8.5 mg/dL — ABNORMAL LOW (ref 8.9–10.3)
Chloride: 103 mmol/L (ref 98–111)
Creatinine, Ser: 0.83 mg/dL (ref 0.61–1.24)
GFR calc Af Amer: >60 mL/min (ref >60)
GFR calc non Af Amer: >60 mL/min (ref >60)
Glucose, Bld: 255 mg/dL — ABNORMAL HIGH (ref 70–99)
Potassium: 3.8 mmol/L (ref 3.5–5.1)
Sodium: 135 mmol/L (ref 135–145)

## 2020-01-19 LAB — GLUCOSE, CAPILLARY
Glucose-Capillary: 364 mg/dL — ABNORMAL HIGH (ref 70–99)
Glucose-Capillary: 249 mg/dL — ABNORMAL HIGH (ref 70–99)
Glucose-Capillary: 316 mg/dL — ABNORMAL HIGH (ref 70–99)
Glucose-Capillary: 244 mg/dL — ABNORMAL HIGH (ref 70–99)

## 2020-01-19 MED ORDER — INSULIN ASPART 100 UNIT/ML ~~LOC~~ SOLN
5.0000 [IU] | Freq: Three times a day (TID) | SUBCUTANEOUS | Status: DC
  Administered 2020-01-19 – 2020-01-20 (×4): 5 [IU] via SUBCUTANEOUS

## 2020-01-19 MED ORDER — SODIUM CHLORIDE 0.9 % IV BOLUS
500.0000 mL | Freq: Once | INTRAVENOUS | Status: AC
  Administered 2020-01-19: 500 mL via INTRAVENOUS

## 2020-01-19 MED ORDER — SODIUM CHLORIDE 0.9 % IV BOLUS
1000.0000 mL | Freq: Once | INTRAVENOUS | Status: AC
  Administered 2020-01-19: 1000 mL via INTRAVENOUS

## 2020-01-19 MED ORDER — INSULIN GLARGINE 100 UNIT/ML ~~LOC~~ SOLN
30.0000 [IU] | Freq: Every day | SUBCUTANEOUS | Status: DC
  Administered 2020-01-19: 30 [IU] via SUBCUTANEOUS
  Filled 2020-01-19 (×2): qty 0.3

## 2020-01-19 NOTE — Progress Notes (Signed)
Inpatient Diabetes Program Recommendations  AACE/ADA: New Consensus Statement on Inpatient Glycemic Control   Target Ranges:  Prepandial:   less than 140 mg/dL      Peak postprandial:   less than 180 mg/dL (1-2 hours)      Critically ill patients:  140 - 180 mg/dL  Results for YAQUB, ARNEY (MRN [id number]) as of 01/19/2020 10:24  Ref. Range 01/18/2020 09:54 01/18/2020 12:04 01/18/2020 16:33 01/18/2020 21:42 01/19/2020 06:30  Glucose-Capillary Latest Ref Range: 70 - 99 mg/dL 824 (H) 235 (H) 361 (H) 364 (H) 249 (H)   Results for JAXTON, CASALE (MRN [id number]) as of 01/19/2020 10:24  Ref. Range 01/17/2020 21:12  Hemoglobin A1C Latest Ref Range: 4.8 - 5.6 % 14.9 (H)   Review of Glycemic Control  Diabetes history: NO Outpatient Diabetes medications: NA Current orders for Inpatient glycemic control:  Lantus 30 units daily, Novolog 5 units TID with meals, Novolog 0-15 units TID with meals, Novolog 0-5 units QHS  Inpatient Diabetes Program Recommendations:    HbgA1C: A1C 14.9% on 01/17/2020 indicating an average glucose of 381 mg/dl over the past 2-3 months.  Labs: May want to consider ordering a c-peptide.  Insulin at discharge: At time of discharge, please consider discharging on Novolin 70/30 Flexpens (#[id number]) and insulin pen needles (#[id number]). Based on current orders and glucose trends would recommend discharging on Novolin 70/30 24 units BID with breakfast and supper (dose would provide a total of 33.6 units for basal and 14.4 units for meal coverage per day).  Thanks, Orlando Penner, RN, MSN, CDE Diabetes Coordinator Inpatient Diabetes Program [phone] (Team Pager from 8am to 5pm)

## 2020-01-19 NOTE — Plan of Care (Signed)
Continue to monitor

## 2020-01-19 NOTE — Progress Notes (Signed)
PROGRESS NOTE    Anthony Schmidt  YWV:[id number] DOB: 08-Jun-1981 DOA: 01/17/2020 PCP: Patient, No Pcp Per   Brief Narrative: 39 year old with no significant past medical history who presents for further evaluation of polyuria, polydipsia weight loss fatigue and blurry vision.  Patient reports symptoms started approximately 1 month prior to admission.  Evaluation in the ED patient was found to be in DKA anion gap 18, bicarb 17 blood sugar 780, UA with ketonuria.    Assessment & Plan:   Principal Problem:   DKA (diabetic ketoacidoses) ([place]) Active Problems:   Hyperkalemia   1-DKA;  Present with anion gap metabolic acidosis, CBG 854, gap 18, bicarb 17 Treated with IV fluids, Insulin Gtt.  Increase Lantus 30 units, SSI, meals coverage.  Monitor today to determine appropriate insulin home regimen.  Diabetes educator consulted.  HbA1c at 14.  Mild metabolic acidosis, will give IV fluids and adjust insulin regimen.   2-Hyperkalemia; secondary to DKA. Resolved.  Hypokalemia; replaced.   AKI; related to hypovolemia, secondary to DKA. Cr up to 1.3. Improved with IV fluids.   Hyponatremia; pseudohyponatremia. Improved with IV fluids.  Anion Gap metabolic acidosis; secondary to DKA; resolved.   Estimated body mass index is 26.31 kg/m as calculated from the following:   Height as of this encounter: 6' 3"  (1.905 m).   Weight as of this encounter: 95.5 kg.   DVT prophylaxis: Lovenox Code Status: Full code Family Communication: Care discussed with patient.  Disposition Plan:  Status is: Inpatient  Remains inpatient appropriate because:Persistent severe electrolyte disturbances.  Worsening mild metabolic acidosis, plan to give more IV fluids and adjust insulin regimen today   Dispo: The patient is from: Home              Anticipated d/c is to: Home              Anticipated d/c date is: 1 day              Patient currently is not medically stable to  d/c.         Consultants:   none  Procedures:   none  Antimicrobials:    Subjective: He is feeling better today, less sluggish. He denies chest pain, shortness of breath.  Objective: Vitals:   01/18/20 2341 01/19/20 0410 01/19/20 0818 01/19/20 1121  BP: 122/82 115/79 128/89 (!) 125/92  Pulse: 89 82 86 79  Resp: 15 15 15 14   Temp: 98 F (36.7 C) 98.1 F (36.7 C) (!) 97.4 F (36.3 C) (!) 97.5 F (36.4 C)  TempSrc: Oral Oral Oral Tympanic  SpO2: 100% 97% 99% 100%  Weight:      Height:        Intake/Output Summary (Last 24 hours) at 01/19/2020 1402 Last data filed at 01/19/2020 0830 Gross per 24 hour  Intake 2206.57 ml  Output 1000 ml  Net 1206.57 ml   Filed Weights   01/17/20 2020  Weight: 95.5 kg    Examination:  General exam: NAD Respiratory system: CTA Cardiovascular system: S 1, S 2 RRR Gastrointestinal system: BS present, soft, nt Central nervous system: alert Extremities: Symmetric power Skin: No rashes   Data Reviewed: I have personally reviewed following labs and imaging studies  CBC: Recent Labs  Lab 01/17/20 1642 01/17/20 1849  WBC 5.3  --   HGB 13.6 12.6*  HCT 38.9* 37.0*  MCV 90.0  --   PLT 148*  --    Basic Metabolic Panel: Recent Labs  Lab 01/17/20 1947  01/17/20 2112 01/18/20 0310 01/18/20 0720 01/19/20 0405  NA 132* 135 133* 134* 135  K 4.5 5.2* 3.1* 3.0* 3.8  CL 96* 100 103 101 103  CO2 18* 21* 18* 22 19*  GLUCOSE 534* 157* 200* 168* 255*  BUN 19 14 11 9  5*  CREATININE 1.28* 1.13 0.96 0.67 0.83  CALCIUM 8.9 9.6 8.5* 8.8* 8.5*   GFR: Estimated Creatinine Clearance: 141.4 mL/min (by C-G formula based on SCr of 0.83 mg/dL). Liver Function Tests: No results for input(s): AST, ALT, ALKPHOS, BILITOT, PROT, ALBUMIN in the last 168 hours. No results for input(s): LIPASE, AMYLASE in the last 168 hours. No results for input(s): AMMONIA in the last 168 hours. Coagulation Profile: No results for input(s): INR, PROTIME  in the last 168 hours. Cardiac Enzymes: No results for input(s): CKTOTAL, CKMB, CKMBINDEX, TROPONINI in the last 168 hours. BNP (last 3 results) No results for input(s): PROBNP in the last 8760 hours. HbA1C: Recent Labs    01/17/20 2112  HGBA1C 14.9*   CBG: Recent Labs  Lab 01/18/20 1204 01/18/20 1633 01/18/20 2142 01/19/20 0630 01/19/20 1115  GLUCAP 235* 282* 364* 249* 316*   Lipid Profile: No results for input(s): CHOL, HDL, LDLCALC, TRIG, CHOLHDL, LDLDIRECT in the last 72 hours. Thyroid Function Tests: No results for input(s): TSH, T4TOTAL, FREET4, T3FREE, THYROIDAB in the last 72 hours. Anemia Panel: No results for input(s): VITAMINB12, FOLATE, FERRITIN, TIBC, IRON, RETICCTPCT in the last 72 hours. Sepsis Labs: No results for input(s): PROCALCITON, LATICACIDVEN in the last 168 hours.  Recent Results (from the past 240 hour(s))  SARS Coronavirus 2 by RT PCR (hospital order, performed in [facility] hospital lab) Nasopharyngeal Nasopharyngeal Swab     Status: None   Collection Time: 01/17/20  6:00 PM   Specimen: Nasopharyngeal Swab  Result Value Ref Range Status   SARS Coronavirus 2 NEGATIVE NEGATIVE Final    Comment: (NOTE) SARS-CoV-2 target nucleic acids are NOT DETECTED. The SARS-CoV-2 RNA is generally detectable in upper and lower respiratory specimens during the acute phase of infection. The lowest concentration of SARS-CoV-2 viral copies this assay can detect is 250 copies / mL. A negative result does not preclude SARS-CoV-2 infection and should not be used as the sole basis for treatment or other patient management decisions.  A negative result may occur with improper specimen collection / handling, submission of specimen other than nasopharyngeal swab, presence of viral mutation(s) within the areas targeted by this assay, and inadequate number of viral copies (<250 copies / mL). A negative result must be combined with clinical observations, patient history,  and epidemiological information. Fact Sheet for Patients:   StrictlyIdeas.no Fact Sheet for Healthcare Providers: BankingDealers.co.za This test is not yet approved or cleared  by the [place] FDA and has been authorized for detection and/or diagnosis of SARS-CoV-2 by FDA under an Emergency Use Authorization (EUA).  This EUA will remain in effect (meaning this test can be used) for the duration of the COVID-19 declaration under Section 564(b)(1) of the Act, 21 U.S.C. section 360bbb-3(b)(1), unless the authorization is terminated or revoked sooner. Performed at [facility], [place] [address] [place], [place] [address]          Radiology Studies: No results found.      Scheduled Meds:  enoxaparin (LOVENOX) injection  40 mg Subcutaneous Q24H   insulin aspart  0-15 Units Subcutaneous TID WC   insulin aspart  0-5 Units Subcutaneous QHS   insulin aspart  5 Units  Subcutaneous TID WC   insulin glargine  30 Units Subcutaneous Daily   insulin starter kit- pen needles  1 kit Other Once   Continuous Infusions:  sodium chloride 100 mL/hr at 01/19/20 0300     LOS: 2 days    Time spent: 35 minutes.     Elmarie Shiley, MD [facility]   If 7PM-7AM, please contact night-coverage [email or web address]  01/19/2020, 2:02 PM

## 2020-01-20 LAB — BASIC METABOLIC PANEL
Anion gap: 9 (ref 5–15)
BUN: <5 mg/dL — ABNORMAL LOW (ref 6–20)
CO2: 25 mmol/L (ref 22–32)
Calcium: 8.2 mg/dL — ABNORMAL LOW (ref 8.9–10.3)
Chloride: 103 mmol/L (ref 98–111)
Creatinine, Ser: 0.86 mg/dL (ref 0.61–1.24)
GFR calc Af Amer: >60 mL/min (ref >60)
GFR calc non Af Amer: >60 mL/min (ref >60)
Glucose, Bld: 218 mg/dL — ABNORMAL HIGH (ref 70–99)
Potassium: 3.6 mmol/L (ref 3.5–5.1)
Sodium: 137 mmol/L (ref 135–145)

## 2020-01-20 LAB — GLUCOSE, CAPILLARY
Glucose-Capillary: 317 mg/dL — ABNORMAL HIGH (ref 70–99)
Glucose-Capillary: 207 mg/dL — ABNORMAL HIGH (ref 70–99)
Glucose-Capillary: 292 mg/dL — ABNORMAL HIGH (ref 70–99)

## 2020-01-20 MED ORDER — INSULIN STARTER KIT- PEN NEEDLES (ENGLISH): 1.0000 | Freq: Once | 0 refills | Status: AC

## 2020-01-20 MED ORDER — INSULIN GLARGINE 100 UNIT/ML ~~LOC~~ SOLN
30.0000 [IU] | Freq: Every day | SUBCUTANEOUS | Status: DC
  Administered 2020-01-20: 30 [IU] via SUBCUTANEOUS
  Filled 2020-01-20: qty 0.3

## 2020-01-20 MED ORDER — NOVOLIN 70/30 FLEXPEN (70-30) 100 UNIT/ML ~~LOC~~ SUPN: 30.0000 [IU] | PEN_INJECTOR | Freq: Two times a day (BID) | SUBCUTANEOUS | 11 refills | Status: DC

## 2020-01-20 MED ORDER — INSULIN PEN NEEDLE 32G X 4 MM MISC: 1.0000 "application " | Freq: Two times a day (BID) | 6 refills | Status: DC

## 2020-01-20 MED ORDER — BLOOD GLUCOSE MONITOR KIT: PACK | 0 refills | Status: AC

## 2020-01-20 NOTE — Discharge Summary (Signed)
Physician Discharge Summary  Anthony Schmidt SNK:[id number] DOB: 1981/05/16 DOA: 01/17/2020  PCP: Patient, No Pcp Per  Admit date: 01/17/2020 Discharge date: 01/20/2020  Admitted From: Home  Disposition:  Home   Recommendations for Outpatient Follow-up:  1. Follow up with PCP in 1-2 weeks 2. Please obtain BMP/CBC in one week 3. Needs BP follow up.  4. Adjust insulin as needed.  5. Evaluation for type 1 Diabetes.   Home Health: none  Discharge Condition: Stable.  CODE STATUS: Full code Diet recommendation: Carb Modified  Brief/Interim Summary: 39 year old with no significant past medical history who presents for further evaluation of polyuria, polydipsia weight loss fatigue and blurry vision.  Patient reports symptoms started approximately 1 month prior to admission.  Evaluation in the ED patient was found to be in DKA anion gap 18, bicarb 17 blood sugar 780, UA with ketonuria.  1-DKA;  Present with anion gap metabolic acidosis, CBG 937, gap 18, bicarb 17 Treated with IV fluids, Insulin Gtt. he received lantus 30 units daily and 35 unit novolog.  Monitor today to determine appropriate insulin home regimen.  Diabetes educator consulted.  HbA1c at 14.  He will be discharge on 70/30 30 units BID. Advised patient to start 70/30 on 6-6  2-Hyperkalemia; secondary to DKA. Resolved.   Hypokalemia; replaced.   AKI; related to hypovolemia, secondary to DKA. Cr up to 1.3. Improved with IV fluids.   Hyponatremia; pseudohyponatremia. Improved with IV fluids.   Anion Gap metabolic acidosis; secondary to DKA; resolved.   Discharge Diagnoses:  Principal Problem:   DKA (diabetic ketoacidoses) ([place]) Active Problems:   Hyperkalemia    Discharge Instructions  Discharge Instructions    Diet Carb Modified   Complete by: As directed    Increase activity slowly   Complete by: As directed      Allergies as of 01/20/2020   No Known Allergies     Medication List    TAKE these  medications   blood glucose meter kit and supplies Kit Dispense based on patient and insurance preference. Use up to four times daily as directed. (FOR ICD-9 250.00, 250.01).   Insulin Pen Needle 32G X 4 MM Misc 1 application by Does not apply route 2 (two) times daily.   insulin starter kit- pen needles Misc 1 kit by Other route once for 1 dose.   multivitamin with minerals Tabs tablet Take 1 tablet by mouth daily.   NovoLIN 70/30 FlexPen (70-30) 100 UNIT/ML KwikPen Generic drug: insulin isophane & regular human Inject 30 Units into the skin 2 (two) times daily.      Follow-up Information    [facility] on 02/01/2020.   Why: June 17th at 230pm for appointment Contact information: [address]  [place] [address] [phone]         No Known Allergies  Consultations:  none   Procedures/Studies:  No results found.  Subjective: Feeling well.   Discharge Exam: Vitals:   01/20/20 0424 01/20/20 0800  BP: 131/87 (!) 145/101  Pulse: 73 86  Resp: 16   Temp: 97.9 F (36.6 C)   SpO2: 99%      General: Pt is alert, awake, not in acute distress Cardiovascular: RRR, S1/S2 +, no rubs, no gallops Respiratory: CTA bilaterally, no wheezing, no rhonchi Abdominal: Soft, NT, ND, bowel sounds + Extremities: no edema, no cyanosis    The results of significant diagnostics from this hospitalization (including imaging, microbiology, ancillary and laboratory) are listed below  for reference.     Microbiology: Recent Results (from the past 240 hour(s))  SARS Coronavirus 2 by RT PCR (hospital order, performed in [facility] hospital lab) Nasopharyngeal Nasopharyngeal Swab     Status: None   Collection Time: 01/17/20  6:00 PM   Specimen: Nasopharyngeal Swab  Result Value Ref Range Status   SARS Coronavirus 2 NEGATIVE NEGATIVE Final    Comment: (NOTE) SARS-CoV-2 target nucleic acids are NOT DETECTED. The SARS-CoV-2  RNA is generally detectable in upper and lower respiratory specimens during the acute phase of infection. The lowest concentration of SARS-CoV-2 viral copies this assay can detect is 250 copies / mL. A negative result does not preclude SARS-CoV-2 infection and should not be used as the sole basis for treatment or other patient management decisions.  A negative result may occur with improper specimen collection / handling, submission of specimen other than nasopharyngeal swab, presence of viral mutation(s) within the areas targeted by this assay, and inadequate number of viral copies (<250 copies / mL). A negative result must be combined with clinical observations, patient history, and epidemiological information. Fact Sheet for Patients:   StrictlyIdeas.no Fact Sheet for Healthcare Providers: BankingDealers.co.za This test is not yet approved or cleared  by the [place] FDA and has been authorized for detection and/or diagnosis of SARS-CoV-2 by FDA under an Emergency Use Authorization (EUA).  This EUA will remain in effect (meaning this test can be used) for the duration of the COVID-19 declaration under Section 564(b)(1) of the Act, 21 U.S.C. section 360bbb-3(b)(1), unless the authorization is terminated or revoked sooner. Performed at [facility], [place] [address] [place], [place] [address]      Labs: BNP (last 3 results) No results for input(s): BNP in the last 8760 hours. Basic Metabolic Panel: Recent Labs  Lab 01/17/20 2112 01/18/20 0310 01/18/20 0720 01/19/20 0405 01/20/20 0532  NA 135 133* 134* 135 137  K 5.2* 3.1* 3.0* 3.8 3.6  CL 100 103 101 103 103  CO2 21* 18* 22 19* 25  GLUCOSE 157* 200* 168* 255* 218*  BUN 14 11 9  5* <5*  CREATININE 1.13 0.96 0.67 0.83 0.86  CALCIUM 9.6 8.5* 8.8* 8.5* 8.2*   Liver Function Tests: No results for input(s): AST, ALT, ALKPHOS, BILITOT, PROT, ALBUMIN in the last 168  hours. No results for input(s): LIPASE, AMYLASE in the last 168 hours. No results for input(s): AMMONIA in the last 168 hours. CBC: Recent Labs  Lab 01/17/20 1642 01/17/20 1849  WBC 5.3  --   HGB 13.6 12.6*  HCT 38.9* 37.0*  MCV 90.0  --   PLT 148*  --    Cardiac Enzymes: No results for input(s): CKTOTAL, CKMB, CKMBINDEX, TROPONINI in the last 168 hours. BNP: Invalid input(s): POCBNP CBG: Recent Labs  Lab 01/19/20 0630 01/19/20 1115 01/19/20 1606 01/19/20 2110 01/20/20 0605  GLUCAP 249* 316* 244* 317* 207*   D-Dimer No results for input(s): DDIMER in the last 72 hours. Hgb A1c Recent Labs    01/17/20 2112  HGBA1C 14.9*   Lipid Profile No results for input(s): CHOL, HDL, LDLCALC, TRIG, CHOLHDL, LDLDIRECT in the last 72 hours. Thyroid function studies No results for input(s): TSH, T4TOTAL, T3FREE, THYROIDAB in the last 72 hours.  Invalid input(s): FREET3 Anemia work up No results for input(s): VITAMINB12, FOLATE, FERRITIN, TIBC, IRON, RETICCTPCT in the last 72 hours. Urinalysis    Component Value Date/Time   COLORURINE YELLOW 01/18/2020 [place] 01/18/2020 0857  LABSPEC 1.027 01/18/2020 0857   PHURINE 6.0 01/18/2020 0857   GLUCOSEU >=500 (A) 01/18/2020 0857   HGBUR NEGATIVE 01/18/2020 0857   BILIRUBINUR NEGATIVE 01/18/2020 0857   KETONESUR 20 (A) 01/18/2020 0857   PROTEINUR NEGATIVE 01/18/2020 0857   NITRITE NEGATIVE 01/18/2020 0857   LEUKOCYTESUR NEGATIVE 01/18/2020 0857   Sepsis Labs Invalid input(s): PROCALCITONIN,  WBC,  LACTICIDVEN Microbiology Recent Results (from the past 240 hour(s))  SARS Coronavirus 2 by RT PCR (hospital order, performed in [facility] hospital lab) Nasopharyngeal Nasopharyngeal Swab     Status: None   Collection Time: 01/17/20  6:00 PM   Specimen: Nasopharyngeal Swab  Result Value Ref Range Status   SARS Coronavirus 2 NEGATIVE NEGATIVE Final    Comment: (NOTE) SARS-CoV-2 target nucleic acids are NOT  DETECTED. The SARS-CoV-2 RNA is generally detectable in upper and lower respiratory specimens during the acute phase of infection. The lowest concentration of SARS-CoV-2 viral copies this assay can detect is 250 copies / mL. A negative result does not preclude SARS-CoV-2 infection and should not be used as the sole basis for treatment or other patient management decisions.  A negative result may occur with improper specimen collection / handling, submission of specimen other than nasopharyngeal swab, presence of viral mutation(s) within the areas targeted by this assay, and inadequate number of viral copies (<250 copies / mL). A negative result must be combined with clinical observations, patient history, and epidemiological information. Fact Sheet for Patients:   StrictlyIdeas.no Fact Sheet for Healthcare Providers: BankingDealers.co.za This test is not yet approved or cleared  by the [place] FDA and has been authorized for detection and/or diagnosis of SARS-CoV-2 by FDA under an Emergency Use Authorization (EUA).  This EUA will remain in effect (meaning this test can be used) for the duration of the COVID-19 declaration under Section 564(b)(1) of the Act, 21 U.S.C. section 360bbb-3(b)(1), unless the authorization is terminated or revoked sooner. Performed at [facility], [place] [address], [place], [place] [address]      Time coordinating discharge: 40 minutes  SIGNED:   Elmarie Shiley, MD  [facility]

## 2020-01-20 NOTE — Progress Notes (Signed)
Pt. Received d/c instruction, he verbalized understanding to all instructions, CCMD notified of tele removal, IV site removed.  Pt in no distress at this time, pt. request to ambulate to exit, Pt ambulated with Charge RN to main extrance.

## 2020-01-20 NOTE — Care Management (Signed)
Patient was discharged before I could discuss [facility] program options with him.  Attempted to call patient.  No answer, left VM.  Will assist as needed if patient returns call.

## 2020-01-20 NOTE — Progress Notes (Signed)
Pt BP 145/101. Pt denies pain, sob, no distress noted.  He stated that he became upset with a phone call he received and that could be the reason why.  MD made aware, this will recheck BP in 1 hour when pt. Calms down.  Will continue to monitor pt

## 2020-01-31 NOTE — Progress Notes (Signed)
Patient ID: Anthony Schmidt, male   DOB: 02/05/1981, 39 y.o.   MRN: [id number]     Koty Anctil, is a 39 y.o. male  ZMC:[id number]  AES:[id number]  DOB - [birth year]  Subjective:  Chief Complaint and HPI: Anthony Schmidt is a 39 y.o. male here today to establish care and for a follow up visit After hospitalization from 6/2-01/20/2020 for newly diagnosed diabetes; DKA and an A1C of 14.9%.  For the last week, his lowest blood sugars have been ~120;  Highest about 240.  He and his wife are working on diet changes.  He is having some brief episodes with blurry vision.  Wishes to defer ophthalmology for now bc no insurance.  Compliant with meds and was able to get prescriptions.    From discharge summary: Recommendations for Outpatient Follow-up:  1. Follow up with PCP in 1-2 weeks 2. Please obtain BMP/CBC in one week 3. Needs BP follow up.  4. Adjust insulin as needed.  5. Evaluation for type 1 Diabetes.   Home Health: none  Discharge Condition: Stable.  CODE STATUS: Full code Diet recommendation: Carb Modified  Brief/Interim Summary: 39 year old with no significant past medical history who presents for further evaluation of polyuria, polydipsia weight loss fatigue and blurry vision. Patient reports symptoms started approximately 1 month prior to admission.  Evaluation in the ED patient was found to be in DKA anion gap 18, bicarb 17 blood sugar 780, UA with ketonuria.  1-DKA;  Present with anion gap metabolic acidosis, CBG 021, gap 18, bicarb 17 Treated with IV fluids, Insulin Gtt.he received lantus 30 units daily and 35 unit novolog.  Monitor today to determine appropriate insulin home regimen.  Diabetes educator consulted.  HbA1c at 14. He will be discharge on 70/30 30 units BID. Advised patient to start 70/30 on 6-6  2-Hyperkalemia;secondary to DKA. Resolved.   Hypokalemia; replaced.  AKI; related to hypovolemia, secondary to DKA. Cr up to 1.3. Improved with IV  fluids.   Hyponatremia; pseudohyponatremia. Improved with IV fluids.   Anion Gap metabolic acidosis;secondary to DKA; resolved.   Discharge Diagnoses:  Principal Problem:   DKA (diabetic ketoacidoses) ([place]) Active Problems:   Hyperkalemia   ED/Hospital notes reviewed.   Social History:  Married, has a Radiation protection practitioner, not driving until blood sugars stabilize Family history:  Mom and and dad with DM and htn  ROS:   Constitutional:  No f/c, No night sweats, No unexplained weight loss. EENT:  No vision changes other than blurry vision due to glycemic changes recently, No hearing changes. No mouth, throat, or ear problems.  Respiratory: No cough, No SOB Cardiac: No CP, no palpitations GI:  No abd pain, No N/V/D. GU: No Urinary s/sx Musculoskeletal: No joint pain Neuro: No headache, no dizziness, no motor weakness.  Skin: No rash Endocrine:  No polydipsia. No polyuria.  Psych: Denies SI/HI  No problems updated.  ALLERGIES: No Known Allergies  PAST MEDICAL HISTORY: Past Medical History:  Diagnosis Date   Diabetes mellitus, new onset ([place]) 01/2020    MEDICATIONS AT HOME: Prior to Admission medications   Medication Sig Start Date End Date Taking? Authorizing Provider  blood glucose meter kit and supplies KIT Dispense based on patient and insurance preference. Use up to four times daily as directed. (FOR ICD-9 250.00, 250.01). 01/20/20  Yes Regalado, Belkys A, MD  insulin isophane & regular human (NOVOLIN 70/30 FLEXPEN) (70-30) 100 UNIT/ML KwikPen Inject 30 Units into the skin 2 (two) times daily. 02/01/20  Yes Freeman Caldron  M, PA-C  Insulin Pen Needle 32G X 4 MM MISC 1 application by Does not apply route 2 (two) times daily. 02/01/20  Yes Argentina Donovan, PA-C  Multiple Vitamin (MULTIVITAMIN WITH MINERALS) TABS tablet Take 1 tablet by mouth daily.   Yes [provider]  lisinopril (ZESTRIL) 5 MG tablet Take 1 tablet (5 mg total) by mouth daily. 02/01/20    Argentina Donovan, PA-C     Objective:  EXAM:   Vitals:   02/01/20 1439  BP: (!) 142/80  Pulse: 92  Temp: 97.7 F (36.5 C)  TempSrc: Temporal  SpO2: 98%  Weight: 224 lb (101.6 kg)  Height: 6' 1.1" (1.857 m)    General appearance : A&OX3. NAD. Non-toxic-appearing HEENT: Atraumatic and Normocephalic.  PERRLA. EOM intact.   Neck: supple, no JVD. No cervical lymphadenopathy. No thyromegaly Chest/Lungs:  Breathing-non-labored, Good air entry bilaterally, breath sounds normal without rales, rhonchi, or wheezing  CVS: S1 S2 regular, no murmurs, gallops, rubs  Extremities: Bilateral Lower Ext shows no edema, both legs are warm to touch with = pulse throughout Neurology:  CN II-XII grossly intact, Non focal.   Psych:  TP linear. J/I WNL. Normal speech. Appropriate eye contact and affect.  Skin:  No Rash  Data Review Lab Results  Component Value Date   HGBA1C 14.9 (H) 01/17/2020     Assessment & Plan   1. Diabetic ketoacidosis without coma associated with type 2 diabetes mellitus ([place]) Improving control-continue diet changes and current medication regimen.  Continue checking sugars at least tid and record and have ready for next visit.  Consider titration or adding metformin as GFR is ok and he has never been on it.   - Glucose (CBG) - Comprehensive metabolic panel - CBC with Differential/Platelet - Insulin Pen Needle 32G X 4 MM MISC; 1 application by Does not apply route 2 (two) times daily.  Dispense: 30 each; Refill: 6 - insulin isophane & regular human (NOVOLIN 70/30 FLEXPEN) (70-30) 100 UNIT/ML KwikPen; Inject 30 Units into the skin 2 (two) times daily.  Dispense: 15 mL; Refill: 11  2. Hypertension, unspecified type Come BP normotensive and several high readings.  Needs ACE for kidney protection so will go ahead and start.  Check BP daily(he has a cuff) and record and have ready for next visit - Comprehensive metabolic panel - CBC with Differential/Platelet - lisinopril  (ZESTRIL) 5 MG tablet; Take 1 tablet (5 mg total) by mouth daily.  Dispense: 90 tablet; Refill: 3  3.  Hospital follow up Doing well Will refer to ophthalmology once insurance/financial assistance is in place to screen for retinopathy.    Extensive patient education/spent >45mns face to face on the above as well as introducing that he will likely be placed on a statin in the future.    Patient have been counseled extensively about nutrition and exercise  Return for 3 weeks for DM and Bp check with Luke(tele should be fine) and assign PCP in 2 months.  The patient was given clear instructions to go to ER or return to medical center if symptoms don't improve, worsen or new problems develop. The patient verbalized understanding. The patient was told to call to get lab results if they haven't heard anything in the next week.     AFreeman Caldron PA-C [facility] [place]  02/01/2020, 3:07 PM

## 2020-02-01 ENCOUNTER — Other Ambulatory Visit: Payer: Self-pay

## 2020-02-01 ENCOUNTER — Other Ambulatory Visit: Payer: Self-pay | Admitting: Physician Assistant

## 2020-02-01 ENCOUNTER — Ambulatory Visit: Payer: Self-pay | Attending: Family Medicine | Admitting: Physician Assistant

## 2020-02-01 VITALS — BP 142/80 | HR 92 | Temp 97.7°F | Ht 73.1 in | Wt 224.0 lb

## 2020-02-01 DIAGNOSIS — I10 Essential (primary) hypertension: Secondary | ICD-10-CM

## 2020-02-01 DIAGNOSIS — Z09 Encounter for follow-up examination after completed treatment for conditions other than malignant neoplasm: Secondary | ICD-10-CM

## 2020-02-01 DIAGNOSIS — E1110 Type 2 diabetes mellitus with ketoacidosis without coma: Secondary | ICD-10-CM

## 2020-02-01 LAB — GLUCOSE, POCT (MANUAL RESULT ENTRY): POC Glucose: 119 mg/dl — AB (ref 70–99)

## 2020-02-01 MED ORDER — INSULIN PEN NEEDLE 32G X 4 MM MISC: 1.0000 "application " | Freq: Two times a day (BID) | 6 refills | Status: DC

## 2020-02-01 MED ORDER — LISINOPRIL 5 MG PO TABS: 5.0000 mg | ORAL_TABLET | Freq: Every day | ORAL | 3 refills | Status: DC

## 2020-02-01 MED ORDER — NOVOLIN 70/30 FLEXPEN (70-30) 100 UNIT/ML ~~LOC~~ SUPN: 30.0000 [IU] | PEN_INJECTOR | Freq: Two times a day (BID) | SUBCUTANEOUS | 11 refills | Status: DC

## 2020-02-01 MED FILL — LISINOPRIL 5 MG TABLET: 5 | 30 days supply | Qty: 30 | Fill #0

## 2020-02-01 NOTE — Patient Instructions (Signed)
Check blood sugars fasting, ~2hours after lunch, and at bedtime and record and have ready for next visit  Check blood pressure daily and record and have ready for next visit.  Work to continue to eliminate sugar and white carbs from your diet

## 2020-02-02 LAB — COMPREHENSIVE METABOLIC PANEL
ALT: 25 IU/L (ref 0–44)
AST: 23 IU/L (ref 0–40)
Albumin/Globulin Ratio: 1.5 (ref 1.2–2.2)
Albumin: 4.2 g/dL (ref 4.0–5.0)
Alkaline Phosphatase: 109 IU/L (ref 48–121)
BUN/Creatinine Ratio: 16 (ref 9–20)
BUN: 16 mg/dL (ref 6–24)
Bilirubin Total: 0.3 mg/dL (ref 0.0–1.2)
CO2: 19 mmol/L — ABNORMAL LOW (ref 20–29)
Calcium: 9.7 mg/dL (ref 8.7–10.2)
Chloride: 105 mmol/L (ref 96–106)
Creatinine, Ser: 0.98 mg/dL (ref 0.76–1.27)
GFR calc Af Amer: 111 mL/min/{1.73_m2} (ref >59)
GFR calc non Af Amer: 96 mL/min/{1.73_m2} (ref >59)
Globulin, Total: 2.8 g/dL (ref 1.5–4.5)
Glucose: 102 mg/dL — ABNORMAL HIGH (ref 65–99)
Potassium: 4.2 mmol/L (ref 3.5–5.2)
Sodium: 144 mmol/L (ref 134–144)
Total Protein: 7 g/dL (ref 6.0–8.5)

## 2020-02-02 LAB — CBC WITH DIFFERENTIAL/PLATELET
Basophils Absolute: 0 10*3/uL (ref 0.0–0.2)
Basos: 1 %
EOS (ABSOLUTE): 0.1 10*3/uL (ref 0.0–0.4)
Eos: 2 %
Hematocrit: 36.9 % — ABNORMAL LOW (ref 37.5–51.0)
Hemoglobin: 12.4 g/dL — ABNORMAL LOW (ref 13.0–17.7)
Immature Grans (Abs): 0.1 10*3/uL (ref 0.0–0.1)
Immature Granulocytes: 1 %
Lymphocytes Absolute: 1.8 10*3/uL (ref 0.7–3.1)
Lymphs: 25 %
MCH: 29.7 pg (ref 26.6–33.0)
MCHC: 33.6 g/dL (ref 31.5–35.7)
MCV: 89 fL (ref 79–97)
Monocytes Absolute: 0.5 10*3/uL (ref 0.1–0.9)
Monocytes: 7 %
Neutrophils Absolute: 4.6 10*3/uL (ref 1.4–7.0)
Neutrophils: 64 %
Platelets: 251 10*3/uL (ref 150–450)
RBC: 4.17 x10E6/uL (ref 4.14–5.80)
RDW: 14.1 % (ref 11.6–15.4)
WBC: 7.1 10*3/uL (ref 3.4–10.8)

## 2020-02-07 MED FILL — TRUEplus 5-BEVEL PEN NEEDLE: 32G X 4 MM | 50 days supply | Qty: 100 | Fill #0

## 2020-02-07 MED FILL — HUMULIN 70/30 KWIKPEN: (70-30) 100 | 25 days supply | Qty: 15 | Fill #0

## 2020-02-09 ENCOUNTER — Encounter (INDEPENDENT_AMBULATORY_CARE_PROVIDER_SITE_OTHER): Payer: Self-pay | Admitting: *Deleted

## 2020-02-22 ENCOUNTER — Ambulatory Visit: Payer: Self-pay | Attending: Family Medicine | Admitting: Pharmacist

## 2020-02-22 ENCOUNTER — Other Ambulatory Visit: Payer: Self-pay

## 2020-02-22 ENCOUNTER — Encounter: Payer: Self-pay | Admitting: Pharmacist

## 2020-02-22 DIAGNOSIS — E1110 Type 2 diabetes mellitus with ketoacidosis without coma: Secondary | ICD-10-CM

## 2020-02-22 DIAGNOSIS — E119 Type 2 diabetes mellitus without complications: Secondary | ICD-10-CM

## 2020-02-22 DIAGNOSIS — Z794 Long term (current) use of insulin: Secondary | ICD-10-CM

## 2020-02-22 MED ORDER — TRUE METRIX METER W/DEVICE KIT: PACK | 0 refills | Status: AC

## 2020-02-22 MED ORDER — TRUE METRIX BLOOD GLUCOSE TEST VI STRP: ORAL_STRIP | 6 refills | Status: DC

## 2020-02-22 MED ORDER — TRUEPLUS LANCETS 28G MISC: 6 refills | Status: AC

## 2020-02-22 MED ORDER — METFORMIN HCL ER 500 MG PO TB24: 500.0000 mg | ORAL_TABLET | Freq: Two times a day (BID) | ORAL | 2 refills | Status: DC

## 2020-02-22 MED FILL — metFORMIN HCL ER 500 MG TB2: 500 | 30 days supply | Qty: 60 | Fill #0

## 2020-02-22 MED FILL — TRUE METRIX TEST STRIP: 33 days supply | Qty: 100 | Fill #0

## 2020-02-22 MED FILL — !TRUE METRIX BLOOD GLUCOSE: 1 days supply | Qty: 1 | Fill #0

## 2020-02-22 MED FILL — TRUEplus LANCETS 28G MISC: 33 days supply | Qty: 100 | Fill #0

## 2020-02-22 NOTE — Progress Notes (Signed)
    S:    PCP: Dr. Alvis Lemmings  No chief complaint on file.  Patient arrives in good spirits.  Presents for diabetes evaluation, education, and management Patient was referred on 02/01/2020 by Marylene Land.   Patient reports Diabetes was diagnosed in June.   Family/Social History:  - FHx: DM (father's side: unsure of what type) - Tobacco: never smoker  - Alcohol: occasionally  Insurance coverage/medication affordability: self pay  Medication adherence reported.   Current diabetes medications include: 70/30 30 units BID Current hypertension medications include: lisinopril 5 mg daily  Current hyperlipidemia medications include: none   Patient reports 1 hypoglycemic event. Attributes this to prolonged time without eating.   Patient reported dietary habits:  - Reports total overhaul of diet   Patient-reported exercise habits:  - Workout daily (resistance bands) - 45 mins-1 hour daily    Patient denies nocturia (nighttime urination).  Patient denies neuropathy (nerve pain). Patient reports visual changes but reveals improvement. Has eye doctor appointment next week.  Patient denies self foot exams.    O:  Lab Results  Component Value Date   HGBA1C 14.9 (H) 01/17/2020   There were no vitals filed for this visit.  Lipid Panel  No results found for: CHOL, TRIG, HDL, CHOLHDL, VLDL, LDLCALC, LDLDIRECT  Home fasting blood sugars: 94 - 122  2 hour post-meal/random blood sugars: 88 - 134. (No levels > 150 in the last week)   Clinical Atherosclerotic Cardiovascular Disease (ASCVD): No  The ASCVD Risk score Denman George DC Jr., et al., 2013) failed to calculate for the following reasons:   Cannot find a previous HDL lab   Cannot find a previous total cholesterol lab    A/P: Diabetes newly dx currently uncontrolled based on A1c but home sugars have improved. Patient is able to verbalize appropriate hypoglycemia management plan (glucose tabs). Medication adherence reported. He has incorporated  drastic changes in his diet and exercise. He wishes to decrease insulin dose.   Kidney function reviewed. Will start patient on metformin and decrease BID 70/30.   -Decreased dose of 70/30 to 25 units BID. -Start metformin 500 mg XR BID.   -True Metrix supplies -Extensively discussed pathophysiology of diabetes, recommended lifestyle interventions, dietary effects on blood sugar control -Counseled on s/sx of and management of hypoglycemia -Next A1C anticipated 04/2020.   ASCVD risk - primary prevention in patient with diabetes. Pt needs lipid panel. At least moderate intensity statin indicated. Pt wishes to await results of lipid panel before starting statin medication. -Lipid, future   Written patient instructions provided.  Total time in face to face counseling 15 minutes.   Follow up PCP Clinic Visit on 04/02/2020.    Butch Penny, PharmD, CPP Clinical Pharmacist [facility] [phone]

## 2020-03-01 MED FILL — HUMULIN 70/30 KWIKPEN: (70-30) 100 | 25 days supply | Qty: 15 | Fill #1

## 2020-03-01 MED FILL — LISINOPRIL 5 MG TABLET: 5 | 30 days supply | Qty: 30 | Fill #1

## 2020-03-01 NOTE — Addendum Note (Signed)
Addended byMemory Dance on: 03/01/2020 01:41 PM   Modules accepted: Orders

## 2020-03-02 LAB — LIPID PANEL
Chol/HDL Ratio: 5.6 ratio — ABNORMAL HIGH (ref 0.0–5.0)
Cholesterol, Total: 229 mg/dL — ABNORMAL HIGH (ref 100–199)
HDL: 41 mg/dL (ref >39)
LDL Chol Calc (NIH): 80 mg/dL (ref 0–99)
Triglycerides: 675 mg/dL (ref 0–149)
VLDL Cholesterol Cal: 108 mg/dL — ABNORMAL HIGH (ref 5–40)

## 2020-03-03 ENCOUNTER — Other Ambulatory Visit: Payer: Self-pay | Admitting: Family Medicine

## 2020-03-03 MED ORDER — ATORVASTATIN CALCIUM 20 MG PO TABS: 20.0000 mg | ORAL_TABLET | Freq: Every day | ORAL | 3 refills | Status: DC

## 2020-03-04 MED FILL — ATORVASTATIN CALCIUM 20 MG: 20 | 30 days supply | Qty: 30 | Fill #0

## 2020-03-06 ENCOUNTER — Telehealth: Payer: Self-pay

## 2020-03-06 NOTE — Telephone Encounter (Signed)
Patient was called and informed of lab results via voicemail. 

## 2020-03-06 NOTE — Telephone Encounter (Signed)
-----   Message from Hoy Register, MD sent at 03/03/2020  9:00 AM EDT ----- Cholesterol is elevated. I have sent a rx for Lipitor to his Pharmacy. Compliance with a low cholesterol diet and exercise will be beneficial

## 2020-03-28 MED FILL — TRUEplus LANCETS 28G MISC: 33 days supply | Qty: 100 | Fill #1

## 2020-03-28 MED FILL — LISINOPRIL 5 MG TABLET: 5 | 30 days supply | Qty: 30 | Fill #2

## 2020-03-28 MED FILL — TRUE METRIX TEST STRIP: 33 days supply | Qty: 100 | Fill #1

## 2020-03-28 MED FILL — metFORMIN HCL ER 500 MG TB2: 500 | 30 days supply | Qty: 60 | Fill #1

## 2020-03-28 MED FILL — HUMULIN 70/30 KWIKPEN: (70-30) 100 | 25 days supply | Qty: 15 | Fill #2

## 2020-03-28 MED FILL — TRUEplus 5-BEVEL PEN NEEDLE: 32G X 4 MM | 50 days supply | Qty: 100 | Fill #1

## 2020-03-28 MED FILL — ATORVASTATIN CALCIUM 20 MG: 20 | 30 days supply | Qty: 30 | Fill #1

## 2020-04-01 ENCOUNTER — Encounter: Payer: Self-pay | Admitting: Nurse Practitioner

## 2020-04-02 ENCOUNTER — Ambulatory Visit: Payer: Self-pay | Admitting: Nurse Practitioner

## 2020-04-07 ENCOUNTER — Observation Stay (HOSPITAL_COMMUNITY)
Admission: EM | Admit: 2020-04-07 | Discharge: 2020-04-08 | Disposition: A | Discharge status: Home / Self Care | Payer: Self-pay | Attending: Internal Medicine | Admitting: Internal Medicine

## 2020-04-07 ENCOUNTER — Observation Stay (HOSPITAL_COMMUNITY): Payer: Self-pay

## 2020-04-07 ENCOUNTER — Emergency Department (HOSPITAL_COMMUNITY): Payer: Self-pay

## 2020-04-07 ENCOUNTER — Other Ambulatory Visit: Payer: Self-pay

## 2020-04-07 ENCOUNTER — Encounter (HOSPITAL_COMMUNITY): Payer: Self-pay | Admitting: Emergency Medicine

## 2020-04-07 DIAGNOSIS — F10921 Alcohol use, unspecified with intoxication delirium: Secondary | ICD-10-CM

## 2020-04-07 DIAGNOSIS — Z20822 Contact with and (suspected) exposure to covid-19: Secondary | ICD-10-CM | POA: Insufficient documentation

## 2020-04-07 DIAGNOSIS — Z79899 Other long term (current) drug therapy: Secondary | ICD-10-CM | POA: Insufficient documentation

## 2020-04-07 DIAGNOSIS — Z794 Long term (current) use of insulin: Secondary | ICD-10-CM | POA: Insufficient documentation

## 2020-04-07 DIAGNOSIS — E7220 Disorder of urea cycle metabolism, unspecified: Secondary | ICD-10-CM | POA: Insufficient documentation

## 2020-04-07 DIAGNOSIS — F10121 Alcohol abuse with intoxication delirium: Secondary | ICD-10-CM | POA: Insufficient documentation

## 2020-04-07 DIAGNOSIS — R55 Syncope and collapse: Principal | ICD-10-CM | POA: Diagnosis present

## 2020-04-07 DIAGNOSIS — E1110 Type 2 diabetes mellitus with ketoacidosis without coma: Secondary | ICD-10-CM | POA: Insufficient documentation

## 2020-04-07 DIAGNOSIS — K7290 Hepatic failure, unspecified without coma: Secondary | ICD-10-CM | POA: Insufficient documentation

## 2020-04-07 DIAGNOSIS — Z96653 Presence of artificial knee joint, bilateral: Secondary | ICD-10-CM | POA: Insufficient documentation

## 2020-04-07 DIAGNOSIS — I10 Essential (primary) hypertension: Secondary | ICD-10-CM | POA: Insufficient documentation

## 2020-04-07 DIAGNOSIS — K7682 Hepatic encephalopathy: Secondary | ICD-10-CM

## 2020-04-07 LAB — COMPREHENSIVE METABOLIC PANEL
ALT: 19 U/L (ref 0–44)
AST: 26 U/L (ref 15–41)
Albumin: 3.7 g/dL (ref 3.5–5.0)
Alkaline Phosphatase: 68 U/L (ref 38–126)
Anion gap: 13 (ref 5–15)
BUN: 11 mg/dL (ref 6–20)
CO2: 22 mmol/L (ref 22–32)
Calcium: 8.9 mg/dL (ref 8.9–10.3)
Chloride: 106 mmol/L (ref 98–111)
Creatinine, Ser: 0.84 mg/dL (ref 0.61–1.24)
GFR calc Af Amer: >60 mL/min (ref >60)
GFR calc non Af Amer: >60 mL/min (ref >60)
Glucose, Bld: 76 mg/dL (ref 70–99)
Potassium: 3 mmol/L — ABNORMAL LOW (ref 3.5–5.1)
Sodium: 141 mmol/L (ref 135–145)
Total Bilirubin: 1.2 mg/dL (ref 0.3–1.2)
Total Protein: 7.4 g/dL (ref 6.5–8.1)

## 2020-04-07 LAB — CBC
HCT: 41.7 % (ref 39.0–52.0)
Hemoglobin: 13.5 g/dL (ref 13.0–17.0)
MCH: 29.7 pg (ref 26.0–34.0)
MCHC: 32.4 g/dL (ref 30.0–36.0)
MCV: 91.6 fL (ref 80.0–100.0)
Platelets: 229 10*3/uL (ref 150–400)
RBC: 4.55 MIL/uL (ref 4.22–5.81)
RDW: 13.4 % (ref 11.5–15.5)
WBC: 5.6 10*3/uL (ref 4.0–10.5)
nRBC: 0 % (ref 0.0–0.2)

## 2020-04-07 LAB — CBC WITH DIFFERENTIAL/PLATELET
Abs Immature Granulocytes: 0.02 10*3/uL (ref 0.00–0.07)
Basophils Absolute: 0 10*3/uL (ref 0.0–0.1)
Basophils Relative: 1 %
Eosinophils Absolute: 0.1 10*3/uL (ref 0.0–0.5)
Eosinophils Relative: 1 %
HCT: 39.9 % (ref 39.0–52.0)
Hemoglobin: 12.8 g/dL — ABNORMAL LOW (ref 13.0–17.0)
Immature Granulocytes: 0 %
Lymphocytes Relative: 29 %
Lymphs Abs: 1.6 10*3/uL (ref 0.7–4.0)
MCH: 29.5 pg (ref 26.0–34.0)
MCHC: 32.1 g/dL (ref 30.0–36.0)
MCV: 91.9 fL (ref 80.0–100.0)
Monocytes Absolute: 0.5 10*3/uL (ref 0.1–1.0)
Monocytes Relative: 8 %
Neutro Abs: 3.3 10*3/uL (ref 1.7–7.7)
Neutrophils Relative %: 61 %
Platelets: 213 10*3/uL (ref 150–400)
RBC: 4.34 MIL/uL (ref 4.22–5.81)
RDW: 13.3 % (ref 11.5–15.5)
WBC: 5.5 10*3/uL (ref 4.0–10.5)
nRBC: 0 % (ref 0.0–0.2)

## 2020-04-07 LAB — URINALYSIS, ROUTINE W REFLEX MICROSCOPIC
Bilirubin Urine: NEGATIVE
Glucose, UA: NEGATIVE mg/dL
Hgb urine dipstick: NEGATIVE
Ketones, ur: NEGATIVE mg/dL
Leukocytes,Ua: NEGATIVE
Nitrite: NEGATIVE
Protein, ur: NEGATIVE mg/dL
Specific Gravity, Urine: 1.02 (ref 1.005–1.030)
pH: 5 (ref 5.0–8.0)

## 2020-04-07 LAB — GLUCOSE, CAPILLARY
Glucose-Capillary: 95 mg/dL (ref 70–99)
Glucose-Capillary: 125 mg/dL — ABNORMAL HIGH (ref 70–99)

## 2020-04-07 LAB — PROTIME-INR
INR: 1 (ref 0.8–1.2)
Prothrombin Time: 12.8 seconds (ref 11.4–15.2)

## 2020-04-07 LAB — HEPATITIS C ANTIBODY: HCV Ab: NONREACTIVE

## 2020-04-07 LAB — BASIC METABOLIC PANEL
Anion gap: 11 (ref 5–15)
BUN: 13 mg/dL (ref 6–20)
CO2: 25 mmol/L (ref 22–32)
Calcium: 9.2 mg/dL (ref 8.9–10.3)
Chloride: 104 mmol/L (ref 98–111)
Creatinine, Ser: 1.1 mg/dL (ref 0.61–1.24)
GFR calc Af Amer: >60 mL/min (ref >60)
GFR calc non Af Amer: >60 mL/min (ref >60)
Glucose, Bld: 109 mg/dL — ABNORMAL HIGH (ref 70–99)
Potassium: 3.9 mmol/L (ref 3.5–5.1)
Sodium: 140 mmol/L (ref 135–145)

## 2020-04-07 LAB — CREATININE, SERUM
Creatinine, Ser: 1.04 mg/dL (ref 0.61–1.24)
GFR calc Af Amer: >60 mL/min (ref >60)
GFR calc non Af Amer: >60 mL/min (ref >60)

## 2020-04-07 LAB — CBG MONITORING, ED
Glucose-Capillary: 85 mg/dL (ref 70–99)
Glucose-Capillary: 102 mg/dL — ABNORMAL HIGH (ref 70–99)

## 2020-04-07 LAB — RAPID URINE DRUG SCREEN, HOSP PERFORMED
Amphetamines: NOT DETECTED
Barbiturates: NOT DETECTED
Benzodiazepines: POSITIVE — AB
Cocaine: NOT DETECTED
Opiates: NOT DETECTED
Tetrahydrocannabinol: NOT DETECTED

## 2020-04-07 LAB — SARS CORONAVIRUS 2 BY RT PCR (HOSPITAL ORDER, PERFORMED IN ~~LOC~~ HOSPITAL LAB): SARS Coronavirus 2: NEGATIVE

## 2020-04-07 LAB — HEMOGLOBIN A1C
Hgb A1c MFr Bld: 6.7 % — ABNORMAL HIGH (ref 4.8–5.6)
Mean Plasma Glucose: 145.59 mg/dL

## 2020-04-07 LAB — TSH: TSH: 1.268 u[IU]/mL (ref 0.350–4.500)

## 2020-04-07 LAB — ACETAMINOPHEN LEVEL: Acetaminophen (Tylenol), Serum: <10 ug/mL — ABNORMAL LOW (ref 10–30)

## 2020-04-07 LAB — SALICYLATE LEVEL: Salicylate Lvl: <7 mg/dL — ABNORMAL LOW (ref 7.0–30.0)

## 2020-04-07 LAB — ETHANOL: Alcohol, Ethyl (B): 218 mg/dL — ABNORMAL HIGH (ref <10)

## 2020-04-07 LAB — MAGNESIUM: Magnesium: 1.7 mg/dL (ref 1.7–2.4)

## 2020-04-07 LAB — AMMONIA
Ammonia: 99 umol/L — ABNORMAL HIGH (ref 9–35)
Ammonia: 26 umol/L (ref 9–35)

## 2020-04-07 LAB — BETA-HYDROXYBUTYRIC ACID: Beta-Hydroxybutyric Acid: <0.05 mmol/L — ABNORMAL LOW (ref 0.05–0.27)

## 2020-04-07 MED ORDER — ONDANSETRON HCL 4 MG PO TABS: 4.0000 mg | ORAL_TABLET | Freq: Four times a day (QID) | ORAL | Status: DC | PRN

## 2020-04-07 MED ORDER — ACETAMINOPHEN 650 MG RE SUPP: 650.0000 mg | Freq: Four times a day (QID) | RECTAL | Status: DC | PRN

## 2020-04-07 MED ORDER — POTASSIUM CHLORIDE 20 MEQ PO PACK
40.0000 meq | PACK | Freq: Once | ORAL | Status: AC
  Administered 2020-04-07: 40 meq via ORAL
  Filled 2020-04-07: qty 2

## 2020-04-07 MED ORDER — INSULIN ASPART 100 UNIT/ML ~~LOC~~ SOLN
0.0000 [IU] | Freq: Three times a day (TID) | SUBCUTANEOUS | Status: DC
  Administered 2020-04-08: 1 [IU] via SUBCUTANEOUS

## 2020-04-07 MED ORDER — ONDANSETRON HCL 4 MG/2ML IJ SOLN: 4.0000 mg | Freq: Four times a day (QID) | INTRAMUSCULAR | Status: DC | PRN

## 2020-04-07 MED ORDER — HYDRALAZINE HCL 10 MG PO TABS: 10.0000 mg | ORAL_TABLET | Freq: Three times a day (TID) | ORAL | Status: DC | PRN

## 2020-04-07 MED ORDER — INSULIN ASPART 100 UNIT/ML ~~LOC~~ SOLN: 0.0000 [IU] | Freq: Every day | SUBCUTANEOUS | Status: DC

## 2020-04-07 MED ORDER — LISINOPRIL 5 MG PO TABS
5.0000 mg | ORAL_TABLET | Freq: Every day | ORAL | Status: DC
  Administered 2020-04-07 – 2020-04-08 (×2): 5 mg via ORAL
  Filled 2020-04-07 (×3): qty 1

## 2020-04-07 MED ORDER — ATORVASTATIN CALCIUM 10 MG PO TABS
20.0000 mg | ORAL_TABLET | Freq: Every day | ORAL | Status: DC
  Administered 2020-04-07 – 2020-04-08 (×2): 20 mg via ORAL
  Filled 2020-04-07 (×2): qty 2

## 2020-04-07 MED ORDER — ENOXAPARIN SODIUM 40 MG/0.4ML ~~LOC~~ SOLN
40.0000 mg | Freq: Every day | SUBCUTANEOUS | Status: DC
  Administered 2020-04-08: 40 mg via SUBCUTANEOUS
  Filled 2020-04-07: qty 0.4

## 2020-04-07 MED ORDER — INSULIN ASPART PROT & ASPART (70-30 MIX) 100 UNIT/ML ~~LOC~~ SUSP
15.0000 [IU] | Freq: Two times a day (BID) | SUBCUTANEOUS | Status: DC
  Administered 2020-04-07 – 2020-04-08 (×2): 15 [IU] via SUBCUTANEOUS
  Filled 2020-04-07 (×2): qty 10

## 2020-04-07 MED ORDER — METFORMIN HCL ER 500 MG PO TB24
500.0000 mg | ORAL_TABLET | Freq: Every day | ORAL | Status: DC
  Administered 2020-04-08: 500 mg via ORAL
  Filled 2020-04-07: qty 1

## 2020-04-07 MED ORDER — POTASSIUM CHLORIDE IN NACL 20-0.9 MEQ/L-% IV SOLN
INTRAVENOUS | Status: AC
  Filled 2020-04-07: qty 1000

## 2020-04-07 MED ORDER — ACETAMINOPHEN 325 MG PO TABS
650.0000 mg | ORAL_TABLET | Freq: Four times a day (QID) | ORAL | Status: DC | PRN
  Administered 2020-04-08: 650 mg via ORAL

## 2020-04-07 NOTE — ED Provider Notes (Signed)
[facility] EMERGENCY DEPARTMENT Provider Note   CSN: [id number] Arrival date & time: 04/07/20  0051     History Chief Complaint  Patient presents with  . Altered Mental Status    Anthony Schmidt is a 39 y.o. male.  Patient brought to the emergency department by EMS from home.  Patient's family found the patient extremely altered and agitated.  EMS report that upon their arrival at the scene, patient had waxing and waning level of consciousness.  He would go from seemingly completely unresponsive to agitated and combative.  He was given Versed for transport, at arrival he is somnolent and does not wake up to stimuli.  Patient reportedly was recently diagnosed with new onset diabetes.  Blood sugar was 137 during transport.  Family indicated that he might have been drinking alcohol tonight. Level V Caveat due to altered mental status.        Past Medical History:  Diagnosis Date  . Diabetes mellitus, new onset ([place]) 01/2020    Patient Active Problem List   Diagnosis Date Noted  . DKA (diabetic ketoacidoses) ([place]) 01/17/2020  . Hyperkalemia 01/17/2020    Past Surgical History:  Procedure Laterality Date  . KNEE ARTHROSCOPY Bilateral   . KNEE SURGERY Bilateral        Family History  Problem Relation Age of Onset  . Diabetes Mother   . Hypertension Mother   . Diabetes Father   . Hypertension Father     Social History   Tobacco Use  . Smoking status: Never Smoker  . Smokeless tobacco: Never Used  Vaping Use  . Vaping Use: Never used  Substance Use Topics  . Alcohol use: Yes    Alcohol/week: 6.0 standard drinks    Types: 6 Cans of beer per week    Comment: occassionally  . Drug use: Never    Home Medications Prior to Admission medications   Medication Sig Start Date End Date Taking? Authorizing Provider  atorvastatin (LIPITOR) 20 MG tablet Take 1 tablet (20 mg total) by mouth daily. 03/03/20   Charlott Rakes, MD  blood glucose meter kit  and supplies KIT Dispense based on patient and insurance preference. Use up to four times daily as directed. (FOR ICD-9 250.00, 250.01). 01/20/20   Regalado, Belkys A, MD  Blood Glucose Monitoring Suppl (TRUE METRIX METER) w/Device KIT Use as instructed to check blood sugar TID. 02/22/20   Charlott Rakes, MD  glucose blood (TRUE METRIX BLOOD GLUCOSE TEST) test strip Use as instructed to check blood sugar TID. 02/22/20   Charlott Rakes, MD  insulin isophane & regular human (NOVOLIN 70/30 FLEXPEN) (70-30) 100 UNIT/ML KwikPen Inject 30 Units into the skin 2 (two) times daily. 02/01/20   Argentina Donovan, PA-C  Insulin Pen Needle 32G X 4 MM MISC 1 application by Does not apply route 2 (two) times daily. 02/01/20   Argentina Donovan, PA-C  lisinopril (ZESTRIL) 5 MG tablet Take 1 tablet (5 mg total) by mouth daily. 02/01/20   Argentina Donovan, PA-C  metFORMIN (GLUCOPHAGE-XR) 500 MG 24 hr tablet Take 1 tablet (500 mg total) by mouth in the morning and at bedtime. 02/22/20   Charlott Rakes, MD  Multiple Vitamin (MULTIVITAMIN WITH MINERALS) TABS tablet Take 1 tablet by mouth daily.    [provider]  TRUEplus Lancets 28G MISC Use as instructed to check blood sugar TID. 02/22/20   Charlott Rakes, MD    Allergies    Patient has no known allergies.  Review of Systems   Review of Systems  Unable to perform ROS: Mental status change    Physical Exam Updated Vital Signs BP 105/68 (BP Location: Left Arm)   Pulse 99   Temp 98.2 F (36.8 C) (Oral)   Resp 15   SpO2 92%   Physical Exam Vitals and nursing note reviewed.  Constitutional:      General: He is not in acute distress.    Appearance: He is well-developed.  HENT:     Head: Normocephalic and atraumatic.     Right Ear: Hearing normal.     Left Ear: Hearing normal.     Nose: Nose normal.  Eyes:     Conjunctiva/sclera: Conjunctivae normal.     Pupils: Pupils are equal, round, and reactive to light.  Cardiovascular:     Rate and Rhythm:  Regular rhythm.     Heart sounds: S1 normal and S2 normal. No murmur heard.  No friction rub. No gallop.   Pulmonary:     Effort: Pulmonary effort is normal. No respiratory distress.     Breath sounds: Normal breath sounds.  Chest:     Chest wall: No tenderness.  Abdominal:     General: Bowel sounds are normal.     Palpations: Abdomen is soft.     Tenderness: There is no abdominal tenderness. There is no guarding or rebound. Negative signs include Murphy's sign and McBurney's sign.     Hernia: No hernia is present.  Musculoskeletal:        General: Normal range of motion.     Cervical back: Normal range of motion and neck supple.  Skin:    General: Skin is warm and dry.     Findings: No rash.  Neurological:     Mental Status: He is oriented to person, place, and time.     GCS: GCS eye subscore is 1. GCS verbal subscore is 2. GCS motor subscore is 4.     Cranial Nerves: No cranial nerve deficit.     Sensory: No sensory deficit.     Coordination: Coordination normal.     ED Results / Procedures / Treatments   Labs (all labs ordered are listed, but only abnormal results are displayed) Labs Reviewed  CBC WITH DIFFERENTIAL/PLATELET - Abnormal; Notable for the following components:      Result Value   Hemoglobin 12.8 (*)    All other components within normal limits  COMPREHENSIVE METABOLIC PANEL - Abnormal; Notable for the following components:   Potassium 3.0 (*)    All other components within normal limits  ETHANOL - Abnormal; Notable for the following components:   Alcohol, Ethyl (B) 218 (*)    All other components within normal limits  BETA-HYDROXYBUTYRIC ACID - Abnormal; Notable for the following components:   Beta-Hydroxybutyric Acid <0.05 (*)    All other components within normal limits  AMMONIA - Abnormal; Notable for the following components:   Ammonia 99 (*)    All other components within normal limits  SALICYLATE LEVEL - Abnormal; Notable for the following  components:   Salicylate Lvl <5.4 (*)    All other components within normal limits  ACETAMINOPHEN LEVEL - Abnormal; Notable for the following components:   Acetaminophen (Tylenol), Serum <10 (*)    All other components within normal limits  SARS CORONAVIRUS 2 BY RT PCR (HOSPITAL ORDER, [facility])  URINALYSIS, ROUTINE W REFLEX MICROSCOPIC  RAPID URINE DRUG SCREEN, HOSP PERFORMED  PROTIME-INR  EKG EKG Interpretation  Date/Time:  Sunday April 07 2020 01:05:15 EDT Ventricular Rate:  98 PR Interval:  178 QRS Duration: 116 QT Interval:  380 QTC Calculation: 485 R Axis:   -3 Text Interpretation: Normal sinus rhythm Minimal voltage criteria for LVH, may be normal variant ( R in aVL ) Inferior infarct , age undetermined Abnormal ECG Confirmed by Orpah Greek [phone]) on 04/07/2020 3:22:00 AM   Radiology CT HEAD WO CONTRAST  Result Date: 04/07/2020 CLINICAL DATA:  Mental status change EXAM: CT HEAD WITHOUT CONTRAST TECHNIQUE: Contiguous axial images were obtained from the base of the skull through the vertex without intravenous contrast. COMPARISON:  None FINDINGS: Brain: No evidence of acute infarction, hemorrhage, hydrocephalus, extra-axial collection or mass lesion/mass effect. Basal cisterns are patent. Midline intracranial structures are unremarkable. Cerebellar tonsils are normally positioned. Vascular: No hyperdense vessel or unexpected calcification. Skull: There is focal soft tissue swelling and thickening of the high parietal scalp slightly more pronounced to the left of midline. No subjacent calvarial fracture is seen. No worrisome osseous lesions. Sinuses/Orbits: Paranasal sinuses and mastoid air cells are predominantly clear. Nonspecific dysconjugate gaze can be seen with altered mental status. Orbits are otherwise unremarkable. Other: None IMPRESSION: 1. No acute intracranial abnormality. 2. Focal soft tissue swelling and thickening of the high  parietal scalp slightly more pronounced to the left of midline. Correlate with point tenderness and visual inspection. 3. No subjacent calvarial fracture. 4. Dysconjugate gaze, nonspecific but can be seen with altered mental status. Electronically Signed   By: Lovena Le M.D.   On: 04/07/2020 02:59   DG Chest Port 1 View  Result Date: 04/07/2020 CLINICAL DATA:  Altered mental status, unresponsive EXAM: PORTABLE CHEST 1 VIEW COMPARISON:  None. FINDINGS: Lung volumes are small, but are symmetric. Mild bibasilar atelectasis. No pneumothorax or pleural effusion. Cardiac size within normal limits. Pulmonary vascularity normal. No acute bone abnormality. IMPRESSION: Low lung volumes with mild bibasilar atelectasis. Electronically Signed   By: Fidela Salisbury MD   On: 04/07/2020 03:13    Procedures Procedures (including critical care time)  Medications Ordered in ED Medications - No data to display  ED Course  I have reviewed the triage vital signs and the nursing notes.  Pertinent labs & imaging results that were available during my care of the patient were reviewed by me and considered in my medical decision making (see chart for details).    MDM Rules/Calculators/A&P                          Patient presents to the emergency department for evaluation of altered mental status.  Family reports that they found him extremely altered.  EMS was called and when they arrived on the scene the patient was extremely somnolent.  When they tried to put him on the stretcher to transport him, however, he became combative.  He was then given 5 mg of Versed.  Since arrival to the ER he has been sleeping and only responds to painful stimuli.  He is breathing comfortably and protecting his airway.  Head CT does not show any acute abnormality.  Alcohol level was 218.  This does not seem to correlate with the level of sedation that he has but Versed is likely adding to the picture.  Additionally, conversation with the  patient's significant other reveals that he is a heavy drinker.  He does not have any known history of cirrhosis but his ammonia level is  99.  This is consistent with hepatic encephalopathy and the patient likely has undiagnosed cirrhosis.  Patient will require hospitalization for further work-up.  Final Clinical Impression(s) / ED Diagnoses Final diagnoses:  Hepatic encephalopathy ([place])  Alcohol intoxication with delirium [facility])    Rx / DC Orders ED Discharge Orders    None       Keelan Pomerleau, Gwenyth Allegra, MD 04/07/20 0430

## 2020-04-07 NOTE — Care Management (Signed)
Patient requesting diet, has not eaten all night paged MD  Chatted MD. Awaiting reply Wife at bedside she states that patient is also diabetic. No orders for blood sugars.

## 2020-04-07 NOTE — Progress Notes (Addendum)
Brief note: -Patient was admitted earlier today. -Patient is a 39 year old African-American male with past medical history significant for newly diagnosed diabetes mellitus type 2, hypertension and hypertriglyceridemia.  Patient was said to have been found slumped against the wall.  Patient was combative on arrival of EMS.  Patient has been drinking heavily.  Alcohol level on presentation was significantly elevated.  Ammonia was 99.  AST and ALT were both normal.  Potassium was started. -On further questioning, patient denies being an alcoholic.  Patient tells me that he only drinks on the weekends.  Patient drinks mainly beer and liquor.  Patient tells me that he does not drink during the weekdays while he is driving.  Patient reported dizziness, vertigo but no associated nausea or vomiting.  Not particularly clear there is orthostatic component of patient's dizziness.  Patient is not a particularly good historian. -We repeated ammonia level, repeat BMP, and MRI brain to the work-up.  We will also check UA and urine drug screening. -We will monitor patient closely for any alcohol withdrawal symptoms.  Alcohol use and intoxication -Patient denies being an alcoholic.   -Monitor patient closely for any withdrawal signs.   -Counseled patient to quit alcohol use.    Elevated ammonia -Etiology unclear.   -We will repeat ammonia level. -Abdominal ultrasound revealed hepatic steatosis.    Hypokalemia -Patient reported recent diarrhea episodes. -Check magnesium level. -Patient has received some potassium.  Will check BMP. -Further management depend on above.   DM2 - Continue to monitor blood sugar closely. -HbA1c of 6.7% (significant improvement from 14.9% on 01/17/2020).    HTN - Continue lisinopril 5 Mg p.o. once daily -Reasonably controlled. -Goal BP should be less than 130/80 mmHg. -Can titrate the dose of lisinopril upwards if needed.  HLD - Continue  statin  Dizziness/vertigo: -Rule out component of orthostasis. -MRI brain. -Follow other work-up already ordered. Further management depend on above.

## 2020-04-07 NOTE — Progress Notes (Signed)
   04/07/20 1538  Assess: MEWS Score  Temp 98.4 F (36.9 C)  BP (!) 141/86  Pulse Rate 85  Resp 20  Level of Consciousness Alert  SpO2 98 %  O2 Device Room Air  Patient Activity (if Appropriate) In bed  Treat  Pain Scale 0-10  Pain Score 0   - Received a 39 y/o male pt from the ED - on room air, a/ox4, not in distress or pain - Pt. Transferred to bed w/ steady gait noted -V/S checked wnl; Dr.'s order carried out. -Educated pt about Fall risk

## 2020-04-07 NOTE — ED Triage Notes (Signed)
Pt presents to ED BIB [facility]. Per pt's family pt found AMS, combative, uncooperative. Per EMS pt's PERLA, 62mm. Per pt's family pt had new onset of diabetes EMS CBG - 137. Ems given 5mg  versed IM EMS VS:  130/80 HR - 108 14rr 95% RA

## 2020-04-07 NOTE — Progress Notes (Signed)
Lunch Tray ordered 

## 2020-04-07 NOTE — ED Notes (Signed)
Pt transported to [facility]

## 2020-04-07 NOTE — Plan of Care (Signed)

## 2020-04-07 NOTE — H&P (Addendum)
History and Physical    [name] AES:[id number] DOB: 21-Jun-1981 DOA: 04/07/2020  PCP: Gildardo Pounds, NP  Patient coming from: Home  Chief Complaint: Passed out.   HPI: Anthony Schmidt is a 39 y.o. male with medical history significant of Newly diagnosed DM2, HTN, HLD who presents after passing out in the bathroom.  Most of the history was obtained from his wife as patient was somnolent.  Briefly, Anthony Schmidt was at a celebration for his mother (she has passed) today and had beer and liquor, unknown amount.  His sister noted that he was hot and sat inside for much of the day.  When he arrived home he went into the bathroom and his wife found him there a few minutes later slumped against the wall.  She is not sure what happened.  When EMS arrived he was combative and he was give versed.  His wife notes that when he is home, he is a heavy drinker - but will not account for exactly how much he drinks daily.  She is not aware of him ever withdrawing from ETOH.  He did not have any chest pain or abdominal pain of which she is aware.   ED Course: In the ED, he was found to have a low K at 3.0.  An elevated NH4 of 99.  ETOH level was 218.  CT head showed no intracranial abnormality but soft tissue swelling which was not seen on visual inspection.  His Beta hydroxybutyrate and AG were normal.  His ALT/AST were normal.   Review of Systems: As per HPI otherwise all other systems reviewed and are negative.   Past Medical History:  Diagnosis Date  . Diabetes mellitus, new onset ([place]) 01/2020    Past Surgical History:  Procedure Laterality Date  . KNEE ARTHROSCOPY Bilateral   . KNEE SURGERY Bilateral     Social History  reports that he has never smoked. He has never used smokeless tobacco. He reports current alcohol use of about 6.0 standard drinks of alcohol per week. He reports that he does not use drugs.  No Known Allergies  Family History  Problem Relation Age of Onset  . Diabetes Mother    . Hypertension Mother   . Diabetes Father   . Hypertension Father     Prior to Admission medications   Medication Sig Start Date End Date Taking? Authorizing Provider  atorvastatin (LIPITOR) 20 MG tablet Take 1 tablet (20 mg total) by mouth daily. 03/03/20   Charlott Rakes, MD  blood glucose meter kit and supplies KIT Dispense based on patient and insurance preference. Use up to four times daily as directed. (FOR ICD-9 250.00, 250.01). 01/20/20   Regalado, Belkys A, MD  Blood Glucose Monitoring Suppl (TRUE METRIX METER) w/Device KIT Use as instructed to check blood sugar TID. 02/22/20   Charlott Rakes, MD  glucose blood (TRUE METRIX BLOOD GLUCOSE TEST) test strip Use as instructed to check blood sugar TID. 02/22/20   Charlott Rakes, MD  insulin isophane & regular human (NOVOLIN 70/30 FLEXPEN) (70-30) 100 UNIT/ML KwikPen Inject 30 Units into the skin 2 (two) times daily. 02/01/20   Argentina Donovan, PA-C  Insulin Pen Needle 32G X 4 MM MISC 1 application by Does not apply route 2 (two) times daily. 02/01/20   Argentina Donovan, PA-C  lisinopril (ZESTRIL) 5 MG tablet Take 1 tablet (5 mg total) by mouth daily. 02/01/20   Argentina Donovan, PA-C  metFORMIN (GLUCOPHAGE-XR) 500 MG 24 hr tablet  Take 1 tablet (500 mg total) by mouth in the morning and at bedtime. 02/22/20   Charlott Rakes, MD  Multiple Vitamin (MULTIVITAMIN WITH MINERALS) TABS tablet Take 1 tablet by mouth daily.    [provider]  TRUEplus Lancets 28G MISC Use as instructed to check blood sugar TID. 02/22/20   Charlott Rakes, MD    Physical Exam: Vitals:   04/07/20 0059 04/07/20 0513  BP: 105/68 106/73  Pulse: 99 83  Resp: 15   Temp: 98.2 F (36.8 C)   TempSrc: Oral   SpO2: 92% 100%    Constitutional: NAD, calm, comfortable, awake to voice but not answering many questions Eyes: + conjunctival injection, disconjugate gaze.  ENMT: Mucous membranes are dry.  No scalp contusion noted.  Respiratory: CTAB, no wheezing or  rales Cardiovascular: RR, NR, no murmur Abdomen: + TTP over the RUQ, otherwise ND and non tender.  +BS Musculoskeletal: no clubbing / cyanosis. No contractures Skin: no rashes, lesions, ulcers on exposed skin.  He has an area of non raised non macular skin changes at the left upper arm around a tattoo, no erythema.  Neurologic: Difficult to assess as he could not follow commands consistently.  Overall, he was moving in bed easily and had no focal strength deficits.  Memory was abnormal and altered of his preceding 24 hours.  Psychiatric: Mood was anxious.  Speech was clear.  Oriented to person, place.    Labs on Admission: I have personally reviewed following labs and imaging studies  CBC: Recent Labs  Lab 04/07/20 0106  WBC 5.5  NEUTROABS 3.3  HGB 12.8*  HCT 39.9  MCV 91.9  PLT 426    Basic Metabolic Panel: Recent Labs  Lab 04/07/20 0106  NA 141  K 3.0*  CL 106  CO2 22  GLUCOSE 76  BUN 11  CREATININE 0.84  CALCIUM 8.9    GFR: CrCl cannot be calculated (Unknown ideal weight.).  Liver Function Tests: Recent Labs  Lab 04/07/20 0106  AST 26  ALT 19  ALKPHOS 68  BILITOT 1.2  PROT 7.4  ALBUMIN 3.7    Urine analysis:    Component Value Date/Time   COLORURINE YELLOW 01/18/2020 [place] 01/18/2020 0857   LABSPEC 1.027 01/18/2020 0857   PHURINE 6.0 01/18/2020 0857   GLUCOSEU >=500 (A) 01/18/2020 0857   HGBUR NEGATIVE 01/18/2020 0857   BILIRUBINUR NEGATIVE 01/18/2020 0857   KETONESUR 20 (A) 01/18/2020 0857   PROTEINUR NEGATIVE 01/18/2020 0857   NITRITE NEGATIVE 01/18/2020 0857   LEUKOCYTESUR NEGATIVE 01/18/2020 0857    Radiological Exams on Admission: CT HEAD WO CONTRAST  Result Date: 04/07/2020 CLINICAL DATA:  Mental status change EXAM: CT HEAD WITHOUT CONTRAST TECHNIQUE: Contiguous axial images were obtained from the base of the skull through the vertex without intravenous contrast. COMPARISON:  None FINDINGS: Brain: No evidence of acute  infarction, hemorrhage, hydrocephalus, extra-axial collection or mass lesion/mass effect. Basal cisterns are patent. Midline intracranial structures are unremarkable. Cerebellar tonsils are normally positioned. Vascular: No hyperdense vessel or unexpected calcification. Skull: There is focal soft tissue swelling and thickening of the high parietal scalp slightly more pronounced to the left of midline. No subjacent calvarial fracture is seen. No worrisome osseous lesions. Sinuses/Orbits: Paranasal sinuses and mastoid air cells are predominantly clear. Nonspecific dysconjugate gaze can be seen with altered mental status. Orbits are otherwise unremarkable. Other: None IMPRESSION: 1. No acute intracranial abnormality. 2. Focal soft tissue swelling and thickening of the high parietal scalp slightly  more pronounced to the left of midline. Correlate with point tenderness and visual inspection. 3. No subjacent calvarial fracture. 4. Dysconjugate gaze, nonspecific but can be seen with altered mental status. Electronically Signed   By: Lovena Le M.D.   On: 04/07/2020 02:59   DG Chest Port 1 View  Result Date: 04/07/2020 CLINICAL DATA:  Altered mental status, unresponsive EXAM: PORTABLE CHEST 1 VIEW COMPARISON:  None. FINDINGS: Lung volumes are small, but are symmetric. Mild bibasilar atelectasis. No pneumothorax or pleural effusion. Cardiac size within normal limits. Pulmonary vascularity normal. No acute bone abnormality. IMPRESSION: Low lung volumes with mild bibasilar atelectasis. Electronically Signed   By: Fidela Salisbury MD   On: 04/07/2020 03:13    EKG: Independently reviewed. Possible old inferior infarct.  NSR  Assessment/Plan  Syncope and collapse - Unclear cause, patient cannot remember much of his day at the moment and his wife was not with him.  - Plan for telemetry throughout the day - EEG after Versed given time to wear off - Check U/S of liver to evaluate for liver disease - Check hepatitis C  Ab - Orthostatics when more awake - IVF with NS and KCL at 100cc/hr for 10 hours - Keep NPO - Check TSH  Alcohol use and intoxication - Unclear if he has ever withdrawn from ETOH - Allow detoxification while inpatient  Elevated ammonia - Would not start lactulose at this time, could be a normal variant and not related to his current state as he is intoxicated and given versed by EMS - US of the liver ordered - Will evaluate for other causes of cirrhosis with HCV Ab.   Hypokalemia - Replace in fluids and orally  DM2 - Continue 70/30 at half of home dose, 15 units BID - SSI  HTN - Continue lisinopril  HLD - Continue statin   DVT prophylaxis: Lovenox  Code Status:   Full  Family Communication:  Wife at bedside  Disposition Plan:   Patient is from:  Home  Anticipated DC to:  Home  Anticipated DC date:  04/08/20  Anticipated DC barriers: Work up, patient desire to stay  Consults called:  None  Admission status:  Obs, telemetry   Severity of Illness: The appropriate patient status for this patient is OBSERVATION. Observation status is judged to be reasonable and necessary in order to provide the required intensity of service to ensure the patient's safety. The patient's presenting symptoms, physical exam findings, and initial radiographic and laboratory data in the context of their medical condition is felt to place them at decreased risk for further clinical deterioration. Furthermore, it is anticipated that the patient will be medically stable for discharge from the hospital within 2 midnights of admission. The following factors support the patient status of observation.   " The patient's presenting symptoms include syncope, encephalopathy. " The physical exam findings include encephalopathy. " The initial radiographic and laboratory data are low K.      Gilles Chiquito MD [facility]  How to contact the [facility] Attending or Consulting provider [place] or covering  provider during after hours [place], for this patient?   1. Check the care team in [facility] and look for a) attending/consulting [facility] provider listed and b) the [facility] team listed 2. Log into [email or web address] and use [place]'s universal password to access. If you do not have the password, please contact the hospital operator. 3. Locate the [facility] provider you are looking for under [facility] and page to  a number that you can be directly reached. 4. If you still have difficulty reaching the provider, please page the [facility] (Director on Call) for the Hospitalists listed on amion for assistance.  04/07/2020, 6:08 AM

## 2020-04-08 DIAGNOSIS — R55 Syncope and collapse: Secondary | ICD-10-CM

## 2020-04-08 LAB — BASIC METABOLIC PANEL
Anion gap: 11 (ref 5–15)
BUN: 12 mg/dL (ref 6–20)
CO2: 22 mmol/L (ref 22–32)
Calcium: 9 mg/dL (ref 8.9–10.3)
Chloride: 104 mmol/L (ref 98–111)
Creatinine, Ser: 1.11 mg/dL (ref 0.61–1.24)
GFR calc Af Amer: >60 mL/min (ref >60)
GFR calc non Af Amer: >60 mL/min (ref >60)
Glucose, Bld: 122 mg/dL — ABNORMAL HIGH (ref 70–99)
Potassium: 3.7 mmol/L (ref 3.5–5.1)
Sodium: 137 mmol/L (ref 135–145)

## 2020-04-08 LAB — CBC
HCT: 39.8 % (ref 39.0–52.0)
Hemoglobin: 12.7 g/dL — ABNORMAL LOW (ref 13.0–17.0)
MCH: 29.3 pg (ref 26.0–34.0)
MCHC: 31.9 g/dL (ref 30.0–36.0)
MCV: 91.9 fL (ref 80.0–100.0)
Platelets: 202 10*3/uL (ref 150–400)
RBC: 4.33 MIL/uL (ref 4.22–5.81)
RDW: 13.2 % (ref 11.5–15.5)
WBC: 5.6 10*3/uL (ref 4.0–10.5)
nRBC: 0 % (ref 0.0–0.2)

## 2020-04-08 LAB — GLUCOSE, CAPILLARY
Glucose-Capillary: 121 mg/dL — ABNORMAL HIGH (ref 70–99)
Glucose-Capillary: 135 mg/dL — ABNORMAL HIGH (ref 70–99)
Glucose-Capillary: 115 mg/dL — ABNORMAL HIGH (ref 70–99)

## 2020-04-08 MED ORDER — SODIUM CHLORIDE 0.9 % IV BOLUS
1000.0000 mL | Freq: Once | INTRAVENOUS | Status: AC
  Administered 2020-04-08: 1000 mL via INTRAVENOUS

## 2020-04-08 NOTE — Progress Notes (Signed)
IV bolus completed. PIC has been removed. Patient is currently getting dressed and transport has been called.

## 2020-04-08 NOTE — Plan of Care (Signed)
  Problem: Education: Goal: Knowledge of General Education information will improve Description: Including pain rating scale, medication(s)/side effects and non-pharmacologic comfort measures Outcome: Adequate for Discharge   Problem: Health Behavior/Discharge Planning: Goal: Ability to manage health-related needs will improve Outcome: Adequate for Discharge   Problem: Clinical Measurements: Goal: Ability to maintain clinical measurements within normal limits will improve Outcome: Adequate for Discharge Goal: Will remain free from infection Outcome: Adequate for Discharge Goal: Diagnostic test results will improve Outcome: Adequate for Discharge Goal: Respiratory complications will improve Outcome: Adequate for Discharge Goal: Cardiovascular complication will be avoided Outcome: Adequate for Discharge   Problem: Activity: Goal: Risk for activity intolerance will decrease Outcome: Adequate for Discharge   Problem: Nutrition: Goal: Adequate nutrition will be maintained Outcome: Adequate for Discharge   Problem: Coping: Goal: Level of anxiety will decrease Outcome: Adequate for Discharge   Problem: Elimination: Goal: Will not experience complications related to bowel motility Outcome: Adequate for Discharge Goal: Will not experience complications related to urinary retention Outcome: Adequate for Discharge   Problem: Pain Managment: Goal: General experience of comfort will improve Outcome: Adequate for Discharge   Problem: Safety: Goal: Ability to remain free from injury will improve Outcome: Adequate for Discharge   Problem: Skin Integrity: Goal: Risk for impaired skin integrity will decrease Outcome: Adequate for Discharge   Problem: Education: Goal: Ability to describe self-care measures that may prevent or decrease complications (Diabetes Survival Skills Education) will improve Outcome: Adequate for Discharge Goal: Individualized Educational Video(s) Outcome:  Adequate for Discharge   Problem: Coping: Goal: Ability to adjust to condition or change in health will improve Outcome: Adequate for Discharge   Problem: Fluid Volume: Goal: Ability to maintain a balanced intake and output will improve Outcome: Adequate for Discharge   Problem: Health Behavior/Discharge Planning: Goal: Ability to identify and utilize available resources and services will improve Outcome: Adequate for Discharge Goal: Ability to manage health-related needs will improve Outcome: Adequate for Discharge   Problem: Metabolic: Goal: Ability to maintain appropriate glucose levels will improve Outcome: Adequate for Discharge   Problem: Nutritional: Goal: Maintenance of adequate nutrition will improve Outcome: Adequate for Discharge Goal: Progress toward achieving an optimal weight will improve Outcome: Adequate for Discharge   Problem: Skin Integrity: Goal: Risk for impaired skin integrity will decrease Outcome: Adequate for Discharge   Problem: Tissue Perfusion: Goal: Adequacy of tissue perfusion will improve Outcome: Adequate for Discharge   Problem: Education: Goal: Knowledge of General Education information will improve Description: Including pain rating scale, medication(s)/side effects and non-pharmacologic comfort measures Outcome: Adequate for Discharge   Problem: Health Behavior/Discharge Planning: Goal: Ability to manage health-related needs will improve Outcome: Adequate for Discharge   Problem: Clinical Measurements: Goal: Ability to maintain clinical measurements within normal limits will improve Outcome: Adequate for Discharge Goal: Will remain free from infection Outcome: Adequate for Discharge Goal: Diagnostic test results will improve Outcome: Adequate for Discharge Goal: Respiratory complications will improve Outcome: Adequate for Discharge Goal: Cardiovascular complication will be avoided Outcome: Adequate for Discharge   Problem:  Nutrition: Goal: Adequate nutrition will be maintained Outcome: Adequate for Discharge   Problem: Coping: Goal: Level of anxiety will decrease Outcome: Adequate for Discharge   Problem: Safety: Goal: Ability to remain free from injury will improve Outcome: Adequate for Discharge

## 2020-04-08 NOTE — Progress Notes (Signed)
D/C instructions given and reviewed. No questions asked, encouraged to call with any questions. Tele removed. IV bolus infusing.

## 2020-04-08 NOTE — Progress Notes (Signed)
Inpatient Diabetes Program Recommendations  AACE/ADA: New Consensus Statement on Inpatient Glycemic Control (2015)  Target Ranges:  Prepandial:   less than 140 mg/dL      Peak postprandial:   less than 180 mg/dL (1-2 hours)      Critically ill patients:  140 - 180 mg/dL   Lab Results  Component Value Date   GLUCAP 115 (H) 04/08/2020   HGBA1C 6.7 (H) 04/07/2020    Review of Glycemic Control Results for ZEFERINO, M[place] (MRN [id number]) as of 04/08/2020 13:17  Ref. Range 04/07/2020 12:46 04/07/2020 17:06 04/07/2020 22:54 04/08/2020 06:27 04/08/2020 08:10 04/08/2020 11:33  Glucose-Capillary Latest Ref Range: 70 - 99 mg/dL 564 (H) 95 332 (H) 951 (H) 135 (H) 115 (H)   Diabetes history: DM 2 (newly diagnosed in 01/2020) Outpatient Diabetes medications:  Metformin 500 mg bid Current orders for Inpatient glycemic control:  Novolog sensitive tid with meals and HS Novolog 70/30 mix 15 units bid  Inpatient Diabetes Program Recommendations:    Spoke to patient regarding events of admission.  He admits that he took his insulin on Saturday morning and did not eat all day.  He also was consuming alcohol.  He states that his blood sugars have been much better recently averaging between 80-120 mg/dL.  He has had a few "shaky" spells.  We discussed at length signs and symptoms of low blood sugars and treatment.  His A1C has dropped substanitially since diagnosis.  It appears that he does not need as much insulin as previously?? Consider reducing home dose of insulin to 70/30 15 units bid (as is ordered at the hospital).  Encouraged patient to check blood sugars closely to make sure that they are not increasing.  Note that he was also started on Metformin in July 2021, which seems to have improved blood sugars as well.   We also discussed importance of eating with 70/30 insulin and not skipping meals.  Encouraged him to also keep snacks with him and always check blood sugar prior to driving.  We also discussed ETOH's  effects on blood sugars and the importance of eating if he consumes alcohol and to limit consumption to prevent hypoglycemia.  He states " I don't need to drink, so I think I will just stop so this doesn't happen again".  He would benefit from seeing Endocrinologist as well once he has medical insurance.   Patient appreciative of information. Will attach hypoglycemia prevention/treatment tips as well to d/c instructions.   Thanks  Beryl Meager, RN, BC-ADM Inpatient Diabetes Coordinator Pager [phone] (8a-5p)

## 2020-04-09 ENCOUNTER — Telehealth: Payer: Self-pay

## 2020-04-09 NOTE — Telephone Encounter (Signed)
Transition Care Management Follow-up Telephone Call Date of discharge and from where: 04/08/2020, [facility]   Call placed to patient # [phone], message left with call back requested to this CM # [phone].  He has an appointment with Ms Meredeth Ide, NP 05/10/2020.

## 2020-04-09 NOTE — Discharge Summary (Signed)
Anthony Schmidt BBC:[id number] DOB: 09/18/1980 DOA: 04/07/2020  PCP: Gildardo Pounds, NP  Admit date: 04/07/2020  Discharge date: 04/09/2020  Admitted From: home  Disposition:  home   Recommendations for Outpatient Follow-up:   Follow up with PCP in 1-2 weeks  Home Health: NA   Equipment/Devices: NA  Consultations: none Discharge Condition: improved   CODE STATUS: full   Diet Recommendation: regular   Chief Complaint  Patient presents with  . Altered Mental Status     Brief history of present illness from the day of admission and additional interim summary    Anthony Schmidt is a 39 y.o. male with medical history significant of Newly diagnosed DM2, HTN, HLD who presents after passing out in the bathroom.  Most of the history was obtained from his wife as patient was somnolent.  Briefly, Anthony Schmidt was at a celebration for his mother (she has passed) today and had beer and liquor, unknown amount.  His sister noted that he was hot and sat inside for much of the day.  When he arrived home he went into the bathroom and his wife found him there a few minutes later slumped against the wall.  She is not sure what happened.  When EMS arrived he was combative and he was give versed.  His wife notes that when he is home, he is a heavy drinker - but will not account for exactly how much he drinks daily.  She is not aware of him ever withdrawing from ETOH.  He did not have any chest pain or abdominal pain of which she is aware.   In the ED, he was found to have a low K at 3.0.  An elevated NH4 of 99.  ETOH level was 218.  CT head showed no intracranial abnormality but soft tissue swelling which was not seen on visual inspection.  His Beta hydroxybutyrate and AG were normal.  His ALT/AST were normal.                                                                   Hospital Course   The day after admission patient was back to baseline.  He said he was not sure what happened but he admitted to drinking a fair amount.  Denies any history of seizure disorder.  He denies any further nausea or vomiting.  Patient states he felt wonderful and really wanted to go home.  Patient was discharged home without further testing.  Syncope was most likely secondary to significant inebriation from alcohol.  She was back to baseline upon discharge   Discharge diagnosis     Active Problems:   Syncope and collapse    Discharge instructions    Discharge Instructions    Diet Carb Modified   Complete by: As directed  Discharge instructions   Complete by: As directed    1. Avoid drinking alcohol and definitely do not drink to excess.  2. Stay hydrated with water.  3. See your PCP in 1-2 weeks to make sure you are still doing well.   Increase activity slowly   Complete by: As directed       Discharge Medications   Allergies as of 04/08/2020   No Known Allergies     Medication List    TAKE these medications   acetaminophen 500 MG tablet Commonly known as: TYLENOL Take 1,000-2,000 mg by mouth daily as needed for headache (pain).   atorvastatin 20 MG tablet Commonly known as: LIPITOR Take 1 tablet (20 mg total) by mouth daily.   blood glucose meter kit and supplies Kit Dispense based on patient and insurance preference. Use up to four times daily as directed. (FOR ICD-9 250.00, 250.01).   Insulin Pen Needle 32G X 4 MM Misc 1 application by Does not apply route 2 (two) times daily.   lisinopril 5 MG tablet Commonly known as: ZESTRIL Take 1 tablet (5 mg total) by mouth daily.   metFORMIN 500 MG 24 hr tablet Commonly known as: GLUCOPHAGE-XR Take 1 tablet (500 mg total) by mouth in the morning and at bedtime.   NovoLIN 70/30 FlexPen (70-30) 100 UNIT/ML KwikPen Generic drug: insulin isophane & regular human Inject  30 Units into the skin 2 (two) times daily. What changed:   how much to take  when to take this   True Metrix Blood Glucose Test test strip Generic drug: glucose blood Use as instructed to check blood sugar TID.   True Metrix Meter w/Device Kit Use as instructed to check blood sugar TID.   TRUEplus Lancets 28G Misc Use as instructed to check blood sugar TID.        Follow-up Information    [facility]. Go on 05/10/2020.   Why: @3 :10pm Contact information: [address] [place] [address] [phone]              Major procedures and Radiology Reports - PLEASE review detailed and final reports thoroughly  -        CT HEAD WO CONTRAST  Result Date: 04/07/2020 CLINICAL DATA:  Mental status change EXAM: CT HEAD WITHOUT CONTRAST TECHNIQUE: Contiguous axial images were obtained from the base of the skull through the vertex without intravenous contrast. COMPARISON:  None FINDINGS: Brain: No evidence of acute infarction, hemorrhage, hydrocephalus, extra-axial collection or mass lesion/mass effect. Basal cisterns are patent. Midline intracranial structures are unremarkable. Cerebellar tonsils are normally positioned. Vascular: No hyperdense vessel or unexpected calcification. Skull: There is focal soft tissue swelling and thickening of the high parietal scalp slightly more pronounced to the left of midline. No subjacent calvarial fracture is seen. No worrisome osseous lesions. Sinuses/Orbits: Paranasal sinuses and mastoid air cells are predominantly clear. Nonspecific dysconjugate gaze can be seen with altered mental status. Orbits are otherwise unremarkable. Other: None IMPRESSION: 1. No acute intracranial abnormality. 2. Focal soft tissue swelling and thickening of the high parietal scalp slightly more pronounced to the left of midline. Correlate with point tenderness and visual inspection. 3. No subjacent calvarial fracture. 4.  Dysconjugate gaze, nonspecific but can be seen with altered mental status. Electronically Signed   By: Lovena Le M.D.   On: 04/07/2020 02:59   MR BRAIN WO CONTRAST  Result Date: 04/07/2020 CLINICAL DATA:  Vertigo EXAM: MRI HEAD WITHOUT CONTRAST TECHNIQUE:  Multiplanar, multiecho pulse sequences of the brain and surrounding structures were obtained without intravenous contrast. COMPARISON:  None. FINDINGS: BRAIN: No acute infarct, acute hemorrhage or extra-axial collection. Normal white matter signal. Normal volume of CSF spaces. No chronic microhemorrhage. Normal midline structures. VASCULAR: Major flow voids are preserved. SKULL AND UPPER CERVICAL SPINE: Normal calvarium and skull base. Visualized upper cervical spine and soft tissues are normal. SINUSES/ORBITS: No paranasal sinus fluid levels or advanced mucosal thickening. No mastoid or middle ear effusion. Normal orbits. IMPRESSION: Normal brain MRI. Electronically Signed   By: Ulyses Jarred M.D.   On: 04/07/2020 22:09   DG Chest Port 1 View  Result Date: 04/07/2020 CLINICAL DATA:  Altered mental status, unresponsive EXAM: PORTABLE CHEST 1 VIEW COMPARISON:  None. FINDINGS: Lung volumes are small, but are symmetric. Mild bibasilar atelectasis. No pneumothorax or pleural effusion. Cardiac size within normal limits. Pulmonary vascularity normal. No acute bone abnormality. IMPRESSION: Low lung volumes with mild bibasilar atelectasis. Electronically Signed   By: Fidela Salisbury MD   On: 04/07/2020 03:13   US Abdomen Limited RUQ  Result Date: 04/07/2020 CLINICAL DATA:  39 year old male with history of alcohol abuse. Hyperammonemia. EXAM: ULTRASOUND ABDOMEN LIMITED RIGHT UPPER QUADRANT COMPARISON:  None. FINDINGS: Gallbladder: No gallstones or wall thickening visualized. No sonographic Murphy sign noted by sonographer. Common bile duct: Diameter: 2.2 mm Liver: No focal lesion identified. Increased echogenicity throughout the hepatic parenchyma, suggestive  of hepatic steatosis. Portal vein is patent on color Doppler imaging with normal direction of blood flow towards the liver. Other: None. IMPRESSION: 1. Diffusely increased echogenicity throughout the hepatic parenchyma suggestive of hepatic steatosis. Electronically Signed   By: Vinnie Langton M.D.   On: 04/07/2020 11:43    Micro Results    Recent Results (from the past 240 hour(s))  SARS Coronavirus 2 by RT PCR (hospital order, performed in [facility] hospital lab) Nasopharyngeal Nasopharyngeal Swab     Status: None   Collection Time: 04/07/20  4:25 AM   Specimen: Nasopharyngeal Swab  Result Value Ref Range Status   SARS Coronavirus 2 NEGATIVE NEGATIVE Final    Comment: (NOTE) SARS-CoV-2 target nucleic acids are NOT DETECTED.  The SARS-CoV-2 RNA is generally detectable in upper and lower respiratory specimens during the acute phase of infection. The lowest concentration of SARS-CoV-2 viral copies this assay can detect is 250 copies / mL. A negative result does not preclude SARS-CoV-2 infection and should not be used as the sole basis for treatment or other patient management decisions.  A negative result may occur with improper specimen collection / handling, submission of specimen other than nasopharyngeal swab, presence of viral mutation(s) within the areas targeted by this assay, and inadequate number of viral copies (<250 copies / mL). A negative result must be combined with clinical observations, patient history, and epidemiological information.  Fact Sheet for Patients:   StrictlyIdeas.no  Fact Sheet for Healthcare Providers: BankingDealers.co.za  This test is not yet approved or  cleared by the [place] FDA and has been authorized for detection and/or diagnosis of SARS-CoV-2 by FDA under an Emergency Use Authorization (EUA).  This EUA will remain in effect (meaning this test can be used) for the duration of  the COVID-19 declaration under Section 564(b)(1) of the Act, 21 U.S.C. section 360bbb-3(b)(1), unless the authorization is terminated or revoked sooner.  Performed at [facility], [place] [address], [place], [place] [address]     Today   Subjective    Diem Salminen feels  much improved since admission.  Feels ready to go home.  Denies chest pain, shortness of breath or abdominal pain.  Feels they can take care of themselves with the resources they have at home.  Objective   Blood pressure (!) 165/129, pulse 84, temperature 98 F (36.7 C), temperature source Oral, resp. rate 17, weight 105.1 kg, SpO2 98 %.  No intake or output data in the 24 hours ending 04/09/20 1950  Exam General: Patient appears well and in good spirits sitting up in bed in no acute distress.  Eyes: sclera anicteric, conjuctiva mild injection bilaterally CVS: S1-S2, regular  Respiratory:  decreased air entry bilaterally secondary to decreased inspiratory effort, rales at bases  GI: NABS, soft, NT  LE: No edema.  Neuro: A/O x 3, Moving all extremities equally with normal strength, CN 3-12 intact, grossly nonfocal.  Psych: patient is logical and coherent, judgement and insight appear normal, mood and affect appropriate to situation.    Data Review   CBC w Diff:  Lab Results  Component Value Date   WBC 5.6 04/08/2020   HGB 12.7 (L) 04/08/2020   HGB 12.4 (L) 02/01/2020   HCT 39.8 04/08/2020   HCT 36.9 (L) 02/01/2020   PLT 202 04/08/2020   PLT 251 02/01/2020   LYMPHOPCT 29 04/07/2020   MONOPCT 8 04/07/2020   EOSPCT 1 04/07/2020   BASOPCT 1 04/07/2020    CMP:  Lab Results  Component Value Date   NA 137 04/08/2020   NA 144 02/01/2020   K 3.7 04/08/2020   CL 104 04/08/2020   CO2 22 04/08/2020   BUN 12 04/08/2020   BUN 16 02/01/2020   CREATININE 1.11 04/08/2020   PROT 7.4 04/07/2020   PROT 7.0 02/01/2020   ALBUMIN 3.7 04/07/2020   ALBUMIN 4.2 02/01/2020   BILITOT 1.2 04/07/2020    BILITOT 0.3 02/01/2020   ALKPHOS 68 04/07/2020   AST 26 04/07/2020   ALT 19 04/07/2020  .   Total Time in preparing paper work, data evaluation and todays exam - 35 minutes  Vashti Hey M.D on 04/09/2020 at 7:50 PM  [facility]   Office  [phone]

## 2020-04-10 ENCOUNTER — Telehealth: Payer: Self-pay

## 2020-04-10 NOTE — Telephone Encounter (Addendum)
Transition Care Management Follow-up Telephone Call  Date of discharge and from where: 04/08/2020, [facility]   How have you been since you were released from the hospital? He said he is doing pretty good.  Any questions or concerns?  he said someone in the hospital told him to hold his insulin if his blood sugar was a certain reading, then someone else told him to take it as it is ordered on AVS which is novolin 70/30 inject 30 units twice daily. He will be taking as per AVS.   He said that he has the documentation needed to meet with the financial counselor.  appt scheduled for OC/CFA and Blue card applications 04/12/2020.  Explained to him that he may be eligible for assistance with medication costs at [facility] pharmacy  but will need to complete the financial application process and meet with  pharmacy tech.  He said he has been avoiding alcohol intake.   Items Reviewed:  Did the pt receive and understand the discharge instructions provided? yes  Medications obtained and verified?  he said he has all medications. Insulin orders reviewed. He did not have any other questions about med regime   Any new allergies since your discharge?  none reported   Do you have support at home? Yes  Has glucometer, instructed him to keep a log of his blood sugars. Last evening at 2030 blood sugar was 106.  He has not checked it yet this morning.    Functional Questionnaire: (I = Independent and D = Dependent) ADLs: independent  Follow up appointments reviewed:   PCP Hospital f/u appt confirmed? Rose Phi [place], [place] 05/02/2020  Specialist Hospital f/u appt confirmed? Marland Kitchen None scheduled at this time  Are transportation arrangements needed?   no  If their condition worsens, is the pt aware to call PCP or go to the Emergency Dept.?  yes  Was the patient provided with contact information for the PCP's office or ED?  he has the phone number for the clinic  Was to pt encouraged to call back with  questions or concerns?yes

## 2020-04-10 NOTE — Telephone Encounter (Signed)
Spoke with pt about what he need to bring to the appt, he will call back to reschedule the appt

## 2020-04-12 ENCOUNTER — Ambulatory Visit: Payer: Self-pay

## 2020-04-24 ENCOUNTER — Other Ambulatory Visit: Payer: Self-pay | Admitting: Nurse Practitioner

## 2020-04-24 ENCOUNTER — Telehealth: Payer: Self-pay | Admitting: Nurse Practitioner

## 2020-04-24 ENCOUNTER — Other Ambulatory Visit: Payer: Self-pay | Admitting: Physician Assistant

## 2020-04-24 DIAGNOSIS — E1110 Type 2 diabetes mellitus with ketoacidosis without coma: Secondary | ICD-10-CM

## 2020-04-24 MED ORDER — GLUCOSE 40 % PO GEL: 1.0000 | Freq: Once | ORAL | 1 refills | Status: DC | PRN

## 2020-04-24 MED ORDER — NOVOLIN 70/30 FLEXPEN (70-30) 100 UNIT/ML ~~LOC~~ SUPN: 15.0000 [IU] | PEN_INJECTOR | Freq: Two times a day (BID) | SUBCUTANEOUS | 11 refills | Status: DC

## 2020-04-24 MED FILL — ?ATORVASTATIN 20 MG TABLET: 20 | 30 days supply | Qty: 30 | Fill #2

## 2020-04-24 MED FILL — metFORMIN HCL ER 500 MG TB2: 500 | 30 days supply | Qty: 60 | Fill #2

## 2020-04-24 MED FILL — HUMULIN 70/30 KWIKPEN: (70-30) 100 | 30 days supply | Qty: 9 | Fill #0

## 2020-04-24 MED FILL — LISINOPRIL 5 MG TABLET: 5 | 30 days supply | Qty: 30 | Fill #3

## 2020-04-24 NOTE — Telephone Encounter (Signed)
Please advise.   Copied from CRM [id number]. Topic: General - Other >> Apr 24, 2020  8:15 AM Wyonia Hough E wrote: Reason for CRM: Pts care taker wants to speak with Anthony Schmidt and has some concerns about the pts heath and feels he is not taking care of his self /Please call after 3pm today or anytime any other day/please advise

## 2020-04-24 NOTE — Telephone Encounter (Signed)
All questions answered at this time.

## 2020-04-24 NOTE — Telephone Encounter (Signed)
Requested Prescriptions  Pending Prescriptions Disp Refills  . TRUEPLUS 5-BEVEL PEN NEEDLES 32G X 4 MM MISC [Pharmacy Med Name: TRUEplus 5-BEVEL PEN NEEDLE 32G X 4 MM Miscellaneous] 10 each 6    Sig: USE 2 TIMES A DAY AS DIRECTED     Endocrinology: Diabetes - Testing Supplies Passed - 04/24/2020  3:42 PM      Passed - Valid encounter within last 12 months    Recent Outpatient Visits          2 months ago Type 2 diabetes mellitus without complication, with long-term current use of insulin [facility])   [place] [facility] [place], Cornelius Moras, RPH-CPP   2 months ago Diabetic ketoacidosis without coma associated with type 2 diabetes mellitus [facility])   [place] [facility] [place], Marzella Schlein, [place]      Future Appointments            In 1 week Sharon Seller, Marzella Schlein, PA-C  [facility]   In 2 weeks Claiborne Rigg, NP [facility]

## 2020-04-26 ENCOUNTER — Other Ambulatory Visit: Payer: Self-pay | Admitting: Pharmacist

## 2020-04-26 DIAGNOSIS — E1110 Type 2 diabetes mellitus with ketoacidosis without coma: Secondary | ICD-10-CM

## 2020-04-26 MED ORDER — TRUEPLUS 5-BEVEL PEN NEEDLES 32G X 4 MM MISC: 6 refills | Status: DC

## 2020-05-01 NOTE — Progress Notes (Signed)
Patient ID: Anthony Schmidt, male   DOB: Jan 06, 1981, 39 y.o.   MRN: [id number]   Virtual Visit via Telephone Note  I connected with Anthony Schmidt on 05/02/20 at  2:30 PM EDT by telephone and verified that I am speaking with the correct person using two identifiers.   I discussed the limitations, risks, security and privacy concerns of performing an evaluation and management service by telephone and the availability of in person appointments. I also discussed with the patient that there may be a patient responsible charge related to this service. The patient expressed understanding and agreed to proceed.   History of Present Illness: After hospitalization  8/22-8/24/2021. Blood sugars running from 70-90.  PTA he had not eaten in almost 2 days, he was drinking alcohol but was still taking diabetes meds.  No further episodes of weakness or passing out.  No CP/SOB.  Has an appt 9/24/201 to establish care.  Last A1C=6.7 03/2020.  He does not need any RF  From discharge summary Lenn Squireis a 39 y.o.malewith medical history significant ofNewly diagnosed DM2, HTN, HLD who presents after passing out in the bathroom. Most of the history was obtained from his wife as patient was somnolent. Briefly, Mr. Anthony Schmidt was at a celebration for his mother (she has passed) today and had beer and liquor, unknown amount. His sister noted that he was hot and sat inside for much of the day. When he arrived home he went into the bathroom and his wife found him there a few minutes later slumped against the wall. She is not sure what happened. When EMS arrived he was combative and he was give versed. His wife notes that when he is home, he is a heavy drinker - but will not account for exactly how much he drinks daily. She is not aware of him ever withdrawing from ETOH. He did not have any chest pain or abdominal pain of which she is aware.   In the ED, he was found to have a low K at 3.0. An elevated NH4 of 99.  ETOH level was 218. CT head showed no intracranial abnormality but soft tissue swelling which was not seen on visual inspection. His Beta hydroxybutyrate and AG were normal. His ALT/AST were normal.    Observations/Objective:  NAD.  A&Ox3   Assessment and Plan: 1. Syncope and collapse Resolved-no further episodes  2. Hospital discharge follow-up Doing well.    3. Type 2 diabetes mellitus without complication, with long-term current use of insulin (HCC) Blood sugars controlled currently at 75-100.  No furhter hypoglycemis events.  Continue current regimen.  Eat regular meals and avoid alcohol to prevent further events    Follow Up Instructions: Keep 9/24 appt to establish care.     I discussed the assessment and treatment plan with the patient. The patient was provided an opportunity to ask questions and all were answered. The patient agreed with the plan and demonstrated an understanding of the instructions.   The patient was advised to call back or seek an in-person evaluation if the symptoms worsen or if the condition fails to improve as anticipated.  I provided 11 minutes of non-face-to-face time during this encounter.   [place], PA-C

## 2020-05-02 ENCOUNTER — Ambulatory Visit: Payer: Self-pay | Attending: Physician Assistant | Admitting: Physician Assistant

## 2020-05-02 DIAGNOSIS — Z794 Long term (current) use of insulin: Secondary | ICD-10-CM

## 2020-05-02 DIAGNOSIS — R55 Syncope and collapse: Secondary | ICD-10-CM

## 2020-05-02 DIAGNOSIS — E119 Type 2 diabetes mellitus without complications: Secondary | ICD-10-CM

## 2020-05-02 DIAGNOSIS — Z09 Encounter for follow-up examination after completed treatment for conditions other than malignant neoplasm: Secondary | ICD-10-CM

## 2020-05-10 ENCOUNTER — Other Ambulatory Visit: Payer: Self-pay | Admitting: Nurse Practitioner

## 2020-05-10 ENCOUNTER — Encounter: Payer: Self-pay | Admitting: Nurse Practitioner

## 2020-05-10 ENCOUNTER — Ambulatory Visit: Payer: Self-pay | Attending: Nurse Practitioner | Admitting: Nurse Practitioner

## 2020-05-10 ENCOUNTER — Other Ambulatory Visit: Payer: Self-pay

## 2020-05-10 VITALS — BP 128/86 | HR 80 | Temp 97.7°F | Ht 75.0 in | Wt 234.0 lb

## 2020-05-10 DIAGNOSIS — E785 Hyperlipidemia, unspecified: Secondary | ICD-10-CM

## 2020-05-10 DIAGNOSIS — E1165 Type 2 diabetes mellitus with hyperglycemia: Secondary | ICD-10-CM

## 2020-05-10 DIAGNOSIS — E119 Type 2 diabetes mellitus without complications: Secondary | ICD-10-CM

## 2020-05-10 DIAGNOSIS — Z794 Long term (current) use of insulin: Secondary | ICD-10-CM

## 2020-05-10 LAB — GLUCOSE, POCT (MANUAL RESULT ENTRY): POC Glucose: 97 mg/dl (ref 70–99)

## 2020-05-10 MED ORDER — NOVOLIN 70/30 FLEXPEN (70-30) 100 UNIT/ML ~~LOC~~ SUPN: 10.0000 [IU] | PEN_INJECTOR | Freq: Two times a day (BID) | SUBCUTANEOUS | 11 refills | Status: DC

## 2020-05-10 MED ORDER — METFORMIN HCL ER 500 MG PO TB24: 500.0000 mg | ORAL_TABLET | Freq: Two times a day (BID) | ORAL | 1 refills | Status: DC

## 2020-05-10 MED ORDER — ATORVASTATIN CALCIUM 20 MG PO TABS: 20.0000 mg | ORAL_TABLET | Freq: Every day | ORAL | 1 refills | Status: DC

## 2020-05-10 NOTE — Progress Notes (Signed)
Assessment & Plan:  [name] was seen today for establish care.  Diagnoses and all orders for this visit:  Type 2 diabetes mellitus with hyperglycemia, with long-term current use of insulin (HCC) -     Glucose (CBG) -     insulin isophane & regular human (NOVOLIN 70/30 FLEXPEN) (70-30) 100 UNIT/ML KwikPen; Inject 10 Units into the skin 2 (two) times daily. -     metFORMIN (GLUCOPHAGE-XR) 500 MG 24 hr tablet; Take 1 tablet (500 mg total) by mouth in the morning and at bedtime. -     Ambulatory referral to Ophthalmology Continue blood sugar control as discussed in office today, low carbohydrate diet, and regular physical exercise as tolerated, 150 minutes per week (30 min each day, 5 days per week, or 50 min 3 days per week). Keep blood sugar logs with fasting goal of 90-130 mg/dl, post prandial (after you eat) less than 180.  For Hypoglycemia: BS <60 and Hyperglycemia BS >400; contact the clinic ASAP. Annual eye exams and foot exams are recommended.   Dyslipidemia, goal LDL below 70 -     atorvastatin (LIPITOR) 20 MG tablet; Take 1 tablet (20 mg total) by mouth daily. INSTRUCTIONS: Work on a low fat, heart healthy diet and participate in regular aerobic exercise program by working out at least 150 minutes per week; 5 days a week-30 minutes per day. Avoid red meat/beef/steak,  fried foods. junk foods, sodas, sugary drinks, unhealthy snacking, alcohol and smoking.  Drink at least 80 oz of water per day and monitor your carbohydrate intake daily.    Patient has been counseled on age-appropriate routine health concerns for screening and prevention. These are reviewed and up-to-date. Referrals have been placed accordingly. Immunizations are up-to-date or declined.    Subjective:   Chief Complaint  Patient presents with   Establish Care    Pt. is here to establish care diabetes.    HPI Anthony Schmidt 39 y.o. male presents to office today to establish care for up o new onset DM.  DM TYPE  2 Fasting readings in the am 70s. Postprandial readings 100-110s. Will decrease Novolin 70/30 15 units BID to 10 units BID. LDL nearing goal with atorvastatin 20 mg daily. Denies any statin intolerance or myalgias.  Lab Results  Component Value Date   HGBA1C 6.7 (H) 04/07/2020  Blood pressure is well controlled with lisinopril 64m daily. Denies chest pain, shortness of breath, palpitations, lightheadedness, dizziness, headaches or BLE edema. He is working on diet and exercise in order to lower his BP and improve his DM.  BP Readings from Last 3 Encounters:  05/10/20 128/86  04/08/20 (!) 165/129  02/01/20 (!) 142/80   Lab Results  Component Value Date   LDLCALC 80 03/01/2020   Review of Systems  Constitutional: Negative for fever, malaise/fatigue and weight loss.  HENT: Negative.  Negative for nosebleeds.   Eyes: Negative.  Negative for blurred vision, double vision and photophobia.  Respiratory: Negative.  Negative for cough and shortness of breath.   Cardiovascular: Negative.  Negative for chest pain, palpitations and leg swelling.  Gastrointestinal: Negative.  Negative for heartburn, nausea and vomiting.  Musculoskeletal: Negative.  Negative for myalgias.  Neurological: Negative.  Negative for dizziness, focal weakness, seizures and headaches.  Psychiatric/Behavioral: Negative.  Negative for suicidal ideas.    Past Medical History:  Diagnosis Date   Diabetes mellitus, new onset ([place] 01/[place]    Past Surgical History:  Procedure Laterality Date   KNEE ARTHROSCOPY Bilateral  KNEE SURGERY Bilateral     Family History  Problem Relation Age of Onset   Diabetes Mother    Hypertension Mother    Diabetes Father    Hypertension Father     Social History Reviewed with no changes to be made today.   Outpatient Medications Prior to Visit  Medication Sig Dispense Refill   acetaminophen (TYLENOL) 500 MG tablet Take 1,000-2,000 mg by mouth daily as needed for headache  (pain).     Blood Glucose Monitoring Suppl (TRUE METRIX METER) w/Device KIT Use as instructed to check blood sugar TID. 1 kit 0   dextrose (GLUTOSE) 40 % GEL Take 37.5 g by mouth once as needed for up to 1 dose for low blood sugar. 37.5 g 1   glucose blood (TRUE METRIX BLOOD GLUCOSE TEST) test strip Use as instructed to check blood sugar TID. 100 each 6   Insulin Pen Needle (TRUEPLUS 5-BEVEL PEN NEEDLES) 32G X 4 MM MISC USE 2 TIMES A DAY AS DIRECTED 100 each 6   lisinopril (ZESTRIL) 5 MG tablet Take 1 tablet (5 mg total) by mouth daily. 90 tablet 3   TRUEplus Lancets 28G MISC Use as instructed to check blood sugar TID. 100 each 6   atorvastatin (LIPITOR) 20 MG tablet Take 1 tablet (20 mg total) by mouth daily. 30 tablet 3   insulin isophane & regular human (NOVOLIN 70/30 FLEXPEN) (70-30) 100 UNIT/ML KwikPen Inject 15 Units into the skin 2 (two) times daily. 15 mL 11   metFORMIN (GLUCOPHAGE-XR) 500 MG 24 hr tablet Take 1 tablet (500 mg total) by mouth in the morning and at bedtime. 60 tablet 2   blood glucose meter kit and supplies KIT Dispense based on patient and insurance preference. Use up to four times daily as directed. (FOR ICD-9 250.00, 250.01). (Patient not taking: Reported on 05/10/2020) 1 each 0   No facility-administered medications prior to visit.    No Known Allergies     Objective:    BP 128/86 (BP Location: Left Arm, Patient Position: Sitting, Cuff Size: Large)    Pulse 80    Temp 97.7 F (36.5 C) (Temporal)    Ht 6' 3"  (1.905 m)    Wt 234 lb (106.1 kg)    SpO2 97%    BMI 29.25 kg/m  Wt Readings from Last 3 Encounters:  05/10/20 234 lb (106.1 kg)  04/08/20 231 lb 9.6 oz (105.1 kg)  02/01/20 224 lb (101.6 kg)    Physical Exam Vitals and nursing note reviewed.  Constitutional:      Appearance: He is well-developed.  HENT:     Head: Normocephalic and atraumatic.  Cardiovascular:     Rate and Rhythm: Normal rate and regular rhythm.     Heart sounds: Normal  heart sounds. No murmur heard.  No friction rub. No gallop.   Pulmonary:     Effort: Pulmonary effort is normal. No tachypnea or respiratory distress.     Breath sounds: Normal breath sounds. No decreased breath sounds, wheezing, rhonchi or rales.  Chest:     Chest wall: No tenderness.  Abdominal:     General: Bowel sounds are normal.     Palpations: Abdomen is soft.  Musculoskeletal:        General: Normal range of motion.     Cervical back: Normal range of motion.  Skin:    General: Skin is warm and dry.  Neurological:     Mental Status: He is alert and oriented to person,  place, and time.     Coordination: Coordination normal.  Psychiatric:        Behavior: Behavior normal. Behavior is cooperative.        Thought Content: Thought content normal.        Judgment: Judgment normal.          Patient has been counseled extensively about nutrition and exercise as well as the importance of adherence with medications and regular follow-up. The patient was given clear instructions to go to ER or return to medical center if symptoms don't improve, worsen or new problems develop. The patient verbalized understanding.   Follow-up: Return for 3 weeks fasting labs. See me first week of DECEMBER, NEEDS ORANGE CARD/CAFA APP and explain process.   Gildardo Pounds, FNP-BC [facility] and [facility], [place]   05/10/2020, 4:25 PM

## 2020-05-23 MED FILL — metFORMIN HCL ER 500 MG TB2: 500 | 30 days supply | Qty: 60 | Fill #0

## 2020-05-23 MED FILL — HUMULIN 70/30 KWIKPEN: (70-30) 100 | 30 days supply | Qty: 6 | Fill #0

## 2020-05-23 MED FILL — LISINOPRIL 5 MG TABLET: 5 | 30 days supply | Qty: 30 | Fill #4

## 2020-05-23 MED FILL — TRUE METRIX TEST STRIP: 33 days supply | Qty: 100 | Fill #2

## 2020-05-23 MED FILL — TRUEPLUS PEN NDL 31GX3/16: 31G X 5 MM | 50 days supply | Qty: 100 | Fill #0

## 2020-05-23 MED FILL — ATORVASTATIN CALCIUM 20 MG: 20 | 30 days supply | Qty: 30 | Fill #3

## 2020-06-12 ENCOUNTER — Other Ambulatory Visit: Payer: Self-pay | Admitting: Physician Assistant

## 2020-06-27 MED FILL — ?ATORVASTATIN 20 MG TABLET: 20 | 30 days supply | Qty: 30 | Fill #0

## 2020-06-27 MED FILL — metFORMIN HCL ER 500 MG TB2: 500 | 30 days supply | Qty: 60 | Fill #1

## 2020-06-27 MED FILL — LISINOPRIL 5 MG TABLET: 5 | 30 days supply | Qty: 30 | Fill #5

## 2020-06-27 MED FILL — HUMULIN 70/30 KWIKPEN: (70-30) 100 | 30 days supply | Qty: 6 | Fill #1

## 2020-07-10 ENCOUNTER — Encounter: Payer: Self-pay | Admitting: Nurse Practitioner

## 2020-07-17 ENCOUNTER — Ambulatory Visit: Payer: Self-pay | Admitting: Nurse Practitioner

## 2020-07-17 ENCOUNTER — Other Ambulatory Visit: Payer: Self-pay

## 2020-07-29 ENCOUNTER — Ambulatory Visit: Payer: Self-pay | Attending: Nurse Practitioner | Admitting: Family Medicine

## 2020-07-29 ENCOUNTER — Other Ambulatory Visit: Payer: Self-pay | Admitting: Family Medicine

## 2020-07-29 ENCOUNTER — Other Ambulatory Visit: Payer: Self-pay

## 2020-07-29 ENCOUNTER — Encounter: Payer: Self-pay | Admitting: Family Medicine

## 2020-07-29 VITALS — BP 147/98 | HR 87 | Ht 75.0 in | Wt 236.0 lb

## 2020-07-29 DIAGNOSIS — G629 Polyneuropathy, unspecified: Secondary | ICD-10-CM

## 2020-07-29 DIAGNOSIS — M79674 Pain in right toe(s): Secondary | ICD-10-CM

## 2020-07-29 DIAGNOSIS — M79675 Pain in left toe(s): Secondary | ICD-10-CM

## 2020-07-29 MED ORDER — GABAPENTIN 300 MG PO CAPS: 300.0000 mg | ORAL_CAPSULE | Freq: Every day | ORAL | 3 refills | Status: DC

## 2020-07-29 MED ORDER — IBUPROFEN 600 MG PO TABS: 600.0000 mg | ORAL_TABLET | Freq: Three times a day (TID) | ORAL | 0 refills | Status: DC | PRN

## 2020-07-29 MED FILL — LISINOPRIL 5 MG TABLET: 5 | 30 days supply | Qty: 30 | Fill #6

## 2020-07-29 MED FILL — ?ATORVASTATIN 20 MG TABLET: 20 | 30 days supply | Qty: 30 | Fill #1

## 2020-07-29 MED FILL — GABAPENTIN 300 MG CAPSULE: 300 | 30 days supply | Qty: 30 | Fill #0

## 2020-07-29 MED FILL — ?IBUPROFEN 600 MG TABLETS: 600 | 10 days supply | Qty: 30 | Fill #0

## 2020-07-29 NOTE — Progress Notes (Signed)
Subjective:  Patient ID: Anthony Schmidt, male    DOB: 22-Mar-1981  Age: 39 y.o. MRN: [id number]  CC: Foot Pain   HPI Anthony Schmidt is a 39 year old male patient of Geryl Rankins, NP with history of type 2 diabetes mellitus (A1c 6.7)  Complains the inferior aspect of his b/l toe are sensitive and symptoms started a month ago and he denies presence of numbness, pins and needles. Pain is worse at night. 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 He initially thought it was due to his shoes but he changed his shoes and his shoes are roomy.  Has sometimes noticed erythema in his big toe and swelling but denies a previous history of Gout.  Past Medical History:  Diagnosis Date  . Diabetes mellitus, new onset ([place]) 01/2020    Past Surgical History:  Procedure Laterality Date  . KNEE ARTHROSCOPY Bilateral   . KNEE SURGERY Bilateral     Family History  Problem Relation Age of Onset  . Diabetes Mother   . Hypertension Mother   . Diabetes Father   . Hypertension Father     No Known Allergies  Outpatient Medications Prior to Visit  Medication Sig Dispense Refill  . acetaminophen (TYLENOL) 500 MG tablet Take 1,000-2,000 mg by mouth daily as needed for headache (pain).    Marland Kitchen atorvastatin (LIPITOR) 20 MG tablet Take 1 tablet (20 mg total) by mouth daily. 90 tablet 1  . Blood Glucose Monitoring Suppl (TRUE METRIX METER) w/Device KIT Use as instructed to check blood sugar TID. 1 kit 0  . dextrose (GLUTOSE) 40 % GEL Take 37.5 g by mouth once as needed for up to 1 dose for low blood sugar. 37.5 g 1  . glucose blood (TRUE METRIX BLOOD GLUCOSE TEST) test strip Use as instructed to check blood sugar TID. 100 each 6  . insulin isophane & regular human (NOVOLIN 70/30 FLEXPEN) (70-30) 100 UNIT/ML KwikPen Inject 10 Units into the skin 2 (two) times daily. 15 mL 11  . Insulin Pen Needle (TRUEPLUS 5-BEVEL PEN NEEDLES) 32G X 4 MM MISC USE 2 TIMES A DAY AS DIRECTED 100  each 6  . lisinopril (ZESTRIL) 5 MG tablet Take 1 tablet (5 mg total) by mouth daily. 90 tablet 3  . metFORMIN (GLUCOPHAGE-XR) 500 MG 24 hr tablet Take 1 tablet (500 mg total) by mouth in the morning and at bedtime. 180 tablet 1  . TRUEplus Lancets 28G MISC Use as instructed to check blood sugar TID. 100 each 6  . blood glucose meter kit and supplies KIT Dispense based on patient and insurance preference. Use up to four times daily as directed. (FOR ICD-9 250.00, 250.01). (Patient not taking: No sig reported) 1 each 0   No facility-administered medications prior to visit.     ROS Review of Systems  Constitutional: Negative for activity change and appetite change.  HENT: Negative for sinus pressure and sore throat.   Eyes: Negative for visual disturbance.  Respiratory: Negative for cough, chest tightness and shortness of breath.   Cardiovascular: Negative for chest pain and leg swelling.  Gastrointestinal: Negative for abdominal distention, abdominal pain, constipation and diarrhea.  Endocrine: Negative.   Genitourinary: Negative for dysuria.  Musculoskeletal:       See hpi  Skin: Negative for rash.  Allergic/Immunologic: Negative.   Neurological: Negative for weakness, light-headedness and numbness.  Psychiatric/Behavioral: Negative for dysphoric mood and suicidal ideas.    Objective:  BP (!) 147/98  Pulse 87   Ht 6' 3"  (1.905 m)   Wt 236 lb (107 kg)   SpO2 97%   BMI 29.50 kg/m   BP/Weight 07/29/2020 05/10/2020 6/37/8588  Systolic BP 502 774 128  Diastolic BP 98 86 786  Wt. (Lbs) 236 234 231.6  BMI 29.5 29.25 30.47      Physical Exam Constitutional:      Appearance: He is well-developed.  Neck:     Vascular: No JVD.  Cardiovascular:     Rate and Rhythm: Normal rate.     Heart sounds: Normal heart sounds. No murmur heard.   Pulmonary:     Effort: Pulmonary effort is normal.     Breath sounds: Normal breath sounds. No wheezing or rales.  Chest:     Chest  wall: No tenderness.  Abdominal:     General: Bowel sounds are normal. There is no distension.     Palpations: Abdomen is soft. There is no mass.     Tenderness: There is no abdominal tenderness.  Musculoskeletal:        General: Normal range of motion.     Right lower leg: No edema.     Left lower leg: No edema.     Comments: No edema or erythema noted on bilateral toe Tenderness to palpation of base of bilateral big toe 1 tenderness on range of motion.  Neurological:     Mental Status: He is alert and oriented to person, place, and time.  Psychiatric:        Mood and Affect: Mood normal.     CMP Latest Ref Rng & Units 04/08/2020 04/07/2020 04/07/2020  Glucose 70 - 99 mg/dL 122(H) 109(H) -  BUN 6 - 20 mg/dL 12 13 -  Creatinine 0.61 - 1.24 mg/dL 1.11 1.10 1.04  Sodium 135 - 145 mmol/L 137 140 -  Potassium 3.5 - 5.1 mmol/L 3.7 3.9 -  Chloride 98 - 111 mmol/L 104 104 -  CO2 22 - 32 mmol/L 22 25 -  Calcium 8.9 - 10.3 mg/dL 9.0 9.2 -  Total Protein 6.5 - 8.1 g/dL - - -  Total Bilirubin 0.3 - 1.2 mg/dL - - -  Alkaline Phos 38 - 126 U/L - - -  AST 15 - 41 U/L - - -  ALT 0 - 44 U/L - - -    Lipid Panel     Component Value Date/Time   CHOL 229 (H) 03/01/2020 1345   TRIG 675 (HH) 03/01/2020 1345   HDL 41 03/01/2020 1345   CHOLHDL 5.6 (H) 03/01/2020 1345   LDLCALC 80 03/01/2020 1345    CBC    Component Value Date/Time   WBC 5.6 04/08/2020 0726   RBC 4.33 04/08/2020 0726   HGB 12.7 (L) 04/08/2020 0726   HGB 12.4 (L) 02/01/2020 1509   HCT 39.8 04/08/2020 0726   HCT 36.9 (L) 02/01/2020 1509   PLT 202 04/08/2020 0726   PLT 251 02/01/2020 1509   MCV 91.9 04/08/2020 0726   MCV 89 02/01/2020 1509   MCH 29.3 04/08/2020 0726   MCHC 31.9 04/08/2020 0726   RDW 13.2 04/08/2020 0726   RDW 14.1 02/01/2020 1509   LYMPHSABS 1.6 04/07/2020 0106   LYMPHSABS 1.8 02/01/2020 1509   MONOABS 0.5 04/07/2020 0106   EOSABS 0.1 04/07/2020 0106   EOSABS 0.1 02/01/2020 1509   BASOSABS 0.0  04/07/2020 0106   BASOSABS 0.0 02/01/2020 1509    Lab Results  Component Value Date   HGBA1C 6.7 (H)  04/07/2020    Assessment & Plan:  1. Neuropathy Initiate gabapentin Discussed sedating side effects - gabapentin (NEURONTIN) 300 MG capsule; Take 1 capsule (300 mg total) by mouth at bedtime.  Dispense: 30 capsule; Refill: 3  2. Pain in toes of both feet We will need to exclude gout If negative could be underlying osteoarthritis Use good support shoes - Uric Acid - ibuprofen (ADVIL) 600 MG tablet; Take 1 tablet (600 mg total) by mouth every 8 (eight) hours as needed.  Dispense: 30 tablet; Refill: 0    Meds ordered this encounter  Medications  . gabapentin (NEURONTIN) 300 MG capsule    Sig: Take 1 capsule (300 mg total) by mouth at bedtime.    Dispense:  30 capsule    Refill:  3  . ibuprofen (ADVIL) 600 MG tablet    Sig: Take 1 tablet (600 mg total) by mouth every 8 (eight) hours as needed.    Dispense:  30 tablet    Refill:  0    Follow-up: Return in about 1 month (around 08/29/2020) for Chronic disease management with PCP.       Charlott Rakes, MD, FAAFP. [facility] and [place]ona [place], [place]   07/29/2020, 6:00 PM

## 2020-07-29 NOTE — Progress Notes (Signed)
Having pain in the bottom of foot, states that area is very sensitive.

## 2020-07-30 LAB — URIC ACID: Uric Acid: 7.7 mg/dL (ref 3.8–8.4)

## 2020-07-31 MED FILL — $HUMULIN 70/30 KWIKPEN: (70-30) 100 | 75 days supply | Qty: 15 | Fill #2

## 2020-08-15 MED FILL — HUMULIN 70/30 KWIKPEN: (70-30) 100 | 30 days supply | Qty: 6 | Fill #2

## 2020-08-15 MED FILL — metFORMIN HCL ER 500 MG TB2: 500 | 30 days supply | Qty: 60 | Fill #2

## 2020-08-26 MED FILL — LISINOPRIL 5 MG TABLET: 5 | 30 days supply | Qty: 30 | Fill #7

## 2020-08-26 MED FILL — ?ATORVASTATIN 20 MG TABLET: 20 | 30 days supply | Qty: 30 | Fill #2

## 2020-08-26 MED FILL — GABAPENTIN 300 MG CAPSULE: 300 | 30 days supply | Qty: 30 | Fill #1

## 2020-08-27 MED FILL — $HUMULIN 70/30 KWIKPEN: (70-30) 100 | 30 days supply | Qty: 6 | Fill #2

## 2020-09-18 MED FILL — metFORMIN HCL ER 500 MG TB2: 500 | 30 days supply | Qty: 60 | Fill #3

## 2020-09-18 MED FILL — TRUEPLUS PEN NDL 31GX3/16: 31G X 5 MM | 50 days supply | Qty: 100 | Fill #1

## 2020-09-24 MED FILL — LISINOPRIL 5 MG TABLET: 5 | 30 days supply | Qty: 30 | Fill #8

## 2020-09-24 MED FILL — ?ATORVASTATIN 20 MG TABLET: 20 | 30 days supply | Qty: 30 | Fill #3

## 2020-10-09 MED FILL — GABAPENTIN 300 MG CAPSULE: 300 | 30 days supply | Qty: 30 | Fill #2

## 2020-10-11 ENCOUNTER — Encounter: Payer: Self-pay | Admitting: Nurse Practitioner

## 2020-10-13 ENCOUNTER — Other Ambulatory Visit: Payer: Self-pay | Admitting: Nurse Practitioner

## 2020-10-13 DIAGNOSIS — M79675 Pain in left toe(s): Secondary | ICD-10-CM

## 2020-10-13 DIAGNOSIS — M79674 Pain in right toe(s): Secondary | ICD-10-CM

## 2020-10-13 DIAGNOSIS — G629 Polyneuropathy, unspecified: Secondary | ICD-10-CM

## 2020-10-13 MED ORDER — IBUPROFEN 600 MG PO TABS: 600.0000 mg | ORAL_TABLET | Freq: Three times a day (TID) | ORAL | 1 refills | Status: DC | PRN

## 2020-10-13 MED ORDER — GABAPENTIN 300 MG PO CAPS: 300.0000 mg | ORAL_CAPSULE | Freq: Three times a day (TID) | ORAL | 1 refills | Status: DC

## 2020-10-14 MED FILL — IBUPROFEN 600 MG TABLET: 600 | 15 days supply | Qty: 60 | Fill #0

## 2020-10-14 MED FILL — GABAPENTIN 300 MG CAPSULE: 300 | 30 days supply | Qty: 90 | Fill #0

## 2020-10-24 MED FILL — ?ATORVASTATIN 20 MG TABLET: 20 | 30 days supply | Qty: 30 | Fill #4

## 2020-10-24 MED FILL — metFORMIN HCL ER 500 MG TB2: 500 | 30 days supply | Qty: 60 | Fill #4

## 2020-10-24 MED FILL — LISINOPRIL 5 MG TABLET: 5 | 30 days supply | Qty: 30 | Fill #9

## 2020-11-13 ENCOUNTER — Encounter: Payer: Self-pay | Admitting: Nurse Practitioner

## 2020-11-14 ENCOUNTER — Other Ambulatory Visit: Payer: Self-pay | Admitting: Nurse Practitioner

## 2020-11-14 MED ORDER — MELOXICAM 15 MG PO TABS: 15.0000 mg | ORAL_TABLET | Freq: Every day | ORAL | 0 refills | Status: DC

## 2020-11-14 MED FILL — MELOXICAM 15 MG TABLET: 15 | 30 days supply | Qty: 30 | Fill #0

## 2020-11-16 ENCOUNTER — Other Ambulatory Visit: Payer: Self-pay

## 2020-11-17 ENCOUNTER — Other Ambulatory Visit: Payer: Self-pay

## 2020-11-25 ENCOUNTER — Other Ambulatory Visit: Payer: Self-pay

## 2020-11-25 MED FILL — Gabapentin Cap 300 MG: ORAL | 30 days supply | Qty: 90 | Fill #0 | Status: AC

## 2020-11-25 MED FILL — Lisinopril Tab 5 MG: ORAL | 30 days supply | Qty: 30 | Fill #0 | Status: AC

## 2020-11-25 MED FILL — Atorvastatin Calcium Tab 20 MG (Base Equivalent): ORAL | 30 days supply | Qty: 30 | Fill #0 | Status: AC

## 2020-11-25 MED FILL — Ibuprofen Tab 600 MG: ORAL | 20 days supply | Qty: 60 | Fill #0 | Status: AC

## 2020-11-25 MED FILL — Metformin HCl Tab ER 24HR 500 MG: ORAL | 30 days supply | Qty: 60 | Fill #0 | Status: AC

## 2020-12-05 ENCOUNTER — Telehealth: Payer: Self-pay | Admitting: Nurse Practitioner

## 2020-12-05 ENCOUNTER — Other Ambulatory Visit: Payer: Self-pay | Admitting: Nurse Practitioner

## 2020-12-05 MED ORDER — MELOXICAM 15 MG PO TABS
ORAL_TABLET | Freq: Every day | ORAL | 1 refills | Status: AC
  Filled 2020-12-05: qty 30, 30d supply, fill #0
  Filled 2021-01-09: qty 30, 30d supply, fill #1

## 2020-12-05 NOTE — Telephone Encounter (Addendum)
Pt friend is calling and pt needs a refill on [email or web address] . Pt friend request to wait  to see zelda and has an appt on 02-14-2021. Pt is available only on mondays or friday  Follow up  on right knee swelling. Pt friend states the swelling has went down. Please advise.

## 2020-12-05 NOTE — Telephone Encounter (Signed)
Will forward to provider  

## 2020-12-06 ENCOUNTER — Other Ambulatory Visit: Payer: Self-pay

## 2020-12-09 ENCOUNTER — Other Ambulatory Visit: Payer: Self-pay

## 2020-12-20 ENCOUNTER — Other Ambulatory Visit: Payer: Self-pay

## 2020-12-24 ENCOUNTER — Other Ambulatory Visit: Payer: Self-pay

## 2020-12-24 ENCOUNTER — Other Ambulatory Visit: Payer: Self-pay | Admitting: Nurse Practitioner

## 2020-12-24 DIAGNOSIS — G629 Polyneuropathy, unspecified: Secondary | ICD-10-CM

## 2020-12-24 DIAGNOSIS — E1165 Type 2 diabetes mellitus with hyperglycemia: Secondary | ICD-10-CM

## 2020-12-24 DIAGNOSIS — E785 Hyperlipidemia, unspecified: Secondary | ICD-10-CM

## 2020-12-24 MED ORDER — GABAPENTIN 300 MG PO CAPS
ORAL_CAPSULE | Freq: Three times a day (TID) | ORAL | 1 refills | Status: DC
  Filled 2020-12-24: qty 90, 30d supply, fill #0
  Filled 2021-03-19: qty 90, 30d supply, fill #1

## 2020-12-24 MED ORDER — METFORMIN HCL ER 500 MG PO TB24
ORAL_TABLET | ORAL | 0 refills | Status: DC
  Filled 2020-12-24: qty 60, 30d supply, fill #0
  Filled 2021-02-18: qty 60, 30d supply, fill #1
  Filled 2021-03-19: qty 60, 30d supply, fill #2

## 2020-12-24 MED FILL — Insulin NPH & Regular Susp Pen-Inj 100 Unit/ML (70-30): SUBCUTANEOUS | 30 days supply | Qty: 6 | Fill #0 | Status: CN

## 2020-12-24 MED FILL — Insulin NPH & Regular Susp Pen-Inj 100 Unit/ML (70-30): SUBCUTANEOUS | 30 days supply | Qty: 6 | Fill #0 | Status: AC

## 2020-12-24 MED FILL — Lisinopril Tab 5 MG: ORAL | 30 days supply | Qty: 30 | Fill #1 | Status: AC

## 2020-12-24 NOTE — Telephone Encounter (Signed)
Future visit in 1 month  

## 2020-12-25 ENCOUNTER — Other Ambulatory Visit: Payer: Self-pay

## 2020-12-27 ENCOUNTER — Other Ambulatory Visit: Payer: Self-pay

## 2020-12-30 ENCOUNTER — Other Ambulatory Visit: Payer: Self-pay | Admitting: Nurse Practitioner

## 2020-12-30 ENCOUNTER — Other Ambulatory Visit: Payer: Self-pay

## 2020-12-30 DIAGNOSIS — E785 Hyperlipidemia, unspecified: Secondary | ICD-10-CM

## 2020-12-30 MED ORDER — ATORVASTATIN CALCIUM 20 MG PO TABS
ORAL_TABLET | Freq: Every day | ORAL | 0 refills | Status: DC
  Filled 2020-12-30: qty 30, 30d supply, fill #0
  Filled 2021-02-18: qty 30, 30d supply, fill #1
  Filled 2021-03-19: qty 30, 30d supply, fill #2

## 2020-12-30 NOTE — Telephone Encounter (Signed)
Spoke with patient. Refill of 11/25/20 was not available per pt. Pt has appt 02/14/21.

## 2021-01-01 ENCOUNTER — Other Ambulatory Visit: Payer: Self-pay

## 2021-01-09 ENCOUNTER — Other Ambulatory Visit: Payer: Self-pay

## 2021-01-09 MED FILL — Insulin Pen Needle 31 G X 5 MM (1/5" or 3/16"): 50 days supply | Qty: 100 | Fill #0 | Status: CN

## 2021-01-16 ENCOUNTER — Other Ambulatory Visit: Payer: Self-pay

## 2021-01-24 ENCOUNTER — Other Ambulatory Visit: Payer: Self-pay

## 2021-01-24 MED FILL — Insulin Pen Needle 31 G X 5 MM (1/5" or 3/16"): 50 days supply | Qty: 100 | Fill #0 | Status: AC

## 2021-02-14 ENCOUNTER — Ambulatory Visit: Payer: Self-pay | Admitting: Nurse Practitioner

## 2021-02-18 ENCOUNTER — Other Ambulatory Visit: Payer: Self-pay

## 2021-02-18 ENCOUNTER — Other Ambulatory Visit: Payer: Self-pay | Admitting: Nurse Practitioner

## 2021-02-18 ENCOUNTER — Other Ambulatory Visit: Payer: Self-pay | Admitting: Physician Assistant

## 2021-02-18 DIAGNOSIS — I10 Essential (primary) hypertension: Secondary | ICD-10-CM

## 2021-02-18 DIAGNOSIS — M79674 Pain in right toe(s): Secondary | ICD-10-CM

## 2021-02-18 DIAGNOSIS — M79675 Pain in left toe(s): Secondary | ICD-10-CM

## 2021-02-18 MED ORDER — IBUPROFEN 600 MG PO TABS
ORAL_TABLET | Freq: Three times a day (TID) | ORAL | 1 refills | Status: DC | PRN
  Filled 2021-02-18: qty 60, 20d supply, fill #0
  Filled 2021-03-19: qty 60, 20d supply, fill #1

## 2021-02-18 MED FILL — Insulin NPH & Regular Susp Pen-Inj 100 Unit/ML (70-30): SUBCUTANEOUS | 30 days supply | Qty: 6 | Fill #1 | Status: AC

## 2021-02-18 NOTE — Telephone Encounter (Signed)
   Notes to clinic:  Patient had appt 02/14/2021 and was canceled by patient  Next appt is 03/12/2021 Review for refill     Requested Prescriptions  Pending Prescriptions Disp Refills   lisinopril (ZESTRIL) 5 MG tablet 90 tablet 3    Sig: TAKE 1 TABLET (5 MG TOTAL) BY MOUTH DAILY.      Cardiovascular:  ACE Inhibitors Failed - 02/18/2021  1:45 PM      Failed - Cr in normal range and within 180 days    Creatinine, Ser  Date Value Ref Range Status  04/08/2020 1.11 0.61 - 1.24 mg/dL Final          Failed - K in normal range and within 180 days    Potassium  Date Value Ref Range Status  04/08/2020 3.7 3.5 - 5.1 mmol/L Final          Failed - Last BP in normal range    BP Readings from Last 1 Encounters:  07/29/20 (!) 147/98          Failed - Valid encounter within last 6 months    Recent Outpatient Visits           6 months ago Neuropathy   [facility] [place], [place], [place]   9 months ago Type 2 diabetes mellitus with hyperglycemia, with long-term current use of insulin [facility])   [place] [place], [place], NP   9 months ago Syncope and collapse   [facility] [place], [place], [place]   12 months ago Type 2 diabetes mellitus without complication, with long-term current use of insulin [facility])   [facility] [place], Cornelius Moras, RPH-CPP   1 year ago Diabetic ketoacidosis without coma associated with type 2 diabetes mellitus [facility])   [place] [facility] [place], Marzella Schlein, [place]       Future Appointments             In 3 weeks Sharon Seller, Marzella Schlein, PA-C [facility]             Passed - Patient is not pregnant

## 2021-02-19 ENCOUNTER — Other Ambulatory Visit: Payer: Self-pay

## 2021-02-19 MED ORDER — LISINOPRIL 5 MG PO TABS
5.0000 mg | ORAL_TABLET | Freq: Every day | ORAL | 0 refills | Status: DC
  Filled 2021-02-19: qty 30, 30d supply, fill #0

## 2021-02-20 ENCOUNTER — Other Ambulatory Visit: Payer: Self-pay

## 2021-02-21 ENCOUNTER — Other Ambulatory Visit: Payer: Self-pay

## 2021-03-12 ENCOUNTER — Ambulatory Visit: Payer: Self-pay | Admitting: Family

## 2021-03-19 ENCOUNTER — Other Ambulatory Visit: Payer: Self-pay | Admitting: Family Medicine

## 2021-03-19 ENCOUNTER — Other Ambulatory Visit: Payer: Self-pay

## 2021-03-19 DIAGNOSIS — I10 Essential (primary) hypertension: Secondary | ICD-10-CM

## 2021-03-19 MED FILL — Insulin NPH & Regular Susp Pen-Inj 100 Unit/ML (70-30): SUBCUTANEOUS | 30 days supply | Qty: 6 | Fill #2 | Status: AC

## 2021-03-19 NOTE — Telephone Encounter (Signed)
Requested medications are due for refill today.  yes  Requested medications are on the active medications list.  yes  Last refill. 02/19/2021  Future visit scheduled.   no  Notes to clinic.  Courtesy refill already given.

## 2021-03-20 ENCOUNTER — Other Ambulatory Visit: Payer: Self-pay

## 2021-03-21 ENCOUNTER — Other Ambulatory Visit: Payer: Self-pay

## 2021-03-26 ENCOUNTER — Other Ambulatory Visit: Payer: Self-pay

## 2021-04-03 ENCOUNTER — Other Ambulatory Visit: Payer: Self-pay

## 2021-04-04 ENCOUNTER — Other Ambulatory Visit: Payer: Self-pay

## 2021-04-04 ENCOUNTER — Other Ambulatory Visit: Payer: Self-pay | Admitting: Family Medicine

## 2021-04-04 DIAGNOSIS — I10 Essential (primary) hypertension: Secondary | ICD-10-CM

## 2021-04-04 NOTE — Telephone Encounter (Signed)
Requested medications are due for refill today yes  Requested medications are on the active medication list yes  Last refill 7/8  Last visit 07/2020, virtual 7/26 stated did not receive call to start appt.  Future visit scheduled 04/30/21  Notes to clinic Pt was given curtesy refill, has upcoming appt, also had virtual visit that he states he did not get the call. Appt 04/30/21.

## 2021-04-07 ENCOUNTER — Other Ambulatory Visit: Payer: Self-pay | Admitting: Nurse Practitioner

## 2021-04-07 ENCOUNTER — Encounter: Payer: Self-pay | Admitting: Nurse Practitioner

## 2021-04-07 DIAGNOSIS — I10 Essential (primary) hypertension: Secondary | ICD-10-CM

## 2021-04-07 MED ORDER — LISINOPRIL 5 MG PO TABS
5.0000 mg | ORAL_TABLET | Freq: Every day | ORAL | 0 refills | Status: DC
  Filled 2021-04-07: qty 30, 30d supply, fill #0

## 2021-04-08 ENCOUNTER — Other Ambulatory Visit: Payer: Self-pay

## 2021-04-09 ENCOUNTER — Other Ambulatory Visit: Payer: Self-pay

## 2021-04-27 ENCOUNTER — Other Ambulatory Visit: Payer: Self-pay | Admitting: Family Medicine

## 2021-04-27 NOTE — Telephone Encounter (Signed)
These pen needles are not on AML- dc'd 04/26/20 Routing request to [facility].

## 2021-04-28 ENCOUNTER — Other Ambulatory Visit: Payer: Self-pay

## 2021-04-28 MED ORDER — TRUEPLUS PEN NEEDLES 31G X 5 MM MISC
0 refills | Status: DC
Start: 2021-04-28 — End: 2022-01-14
  Filled 2021-04-28: qty 100, 50d supply, fill #0

## 2021-04-30 ENCOUNTER — Ambulatory Visit: Payer: Self-pay | Attending: Physician Assistant | Admitting: Physician Assistant

## 2021-04-30 ENCOUNTER — Encounter: Payer: Self-pay | Admitting: Physician Assistant

## 2021-04-30 ENCOUNTER — Other Ambulatory Visit: Payer: Self-pay

## 2021-04-30 VITALS — BP 158/104 | HR 78 | Ht 75.0 in | Wt 229.4 lb

## 2021-04-30 DIAGNOSIS — E785 Hyperlipidemia, unspecified: Secondary | ICD-10-CM

## 2021-04-30 DIAGNOSIS — E1165 Type 2 diabetes mellitus with hyperglycemia: Secondary | ICD-10-CM

## 2021-04-30 DIAGNOSIS — Z794 Long term (current) use of insulin: Secondary | ICD-10-CM

## 2021-04-30 DIAGNOSIS — G629 Polyneuropathy, unspecified: Secondary | ICD-10-CM

## 2021-04-30 DIAGNOSIS — I10 Essential (primary) hypertension: Secondary | ICD-10-CM

## 2021-04-30 LAB — POCT GLYCOSYLATED HEMOGLOBIN (HGB A1C): Hemoglobin A1C: 6.2 % — AB (ref 4.0–5.6)

## 2021-04-30 LAB — GLUCOSE, POCT (MANUAL RESULT ENTRY): POC Glucose: 92 mg/dl (ref 70–99)

## 2021-04-30 MED ORDER — TRUEPLUS PEN NEEDLES 31G X 5 MM MISC
0 refills | Status: DC
  Filled 2021-07-30: qty 100, 50d supply, fill #0

## 2021-04-30 MED ORDER — ATORVASTATIN CALCIUM 20 MG PO TABS
ORAL_TABLET | Freq: Every day | ORAL | 0 refills | Status: DC
  Filled 2021-04-30: qty 30, 30d supply, fill #0
  Filled 2021-05-25: qty 30, 30d supply, fill #1
  Filled 2021-06-25: qty 30, 30d supply, fill #2

## 2021-04-30 MED ORDER — INSULIN ISOPHANE & REGULAR (HUMAN 70-30)100 UNIT/ML KWIKPEN
PEN_INJECTOR | SUBCUTANEOUS | 11 refills | Status: DC
Start: 2021-04-30 — End: 2022-01-14
  Filled 2021-04-30: qty 6, 30d supply, fill #0
  Filled 2021-06-25: qty 6, 30d supply, fill #1
  Filled 2021-07-29: qty 6, 30d supply, fill #2
  Filled 2021-09-21: qty 6, 30d supply, fill #3
  Filled 2021-09-22: qty 6, 30d supply, fill #0
  Filled 2021-12-02: qty 15, 75d supply, fill #1

## 2021-04-30 MED ORDER — METFORMIN HCL ER 500 MG PO TB24
ORAL_TABLET | ORAL | 0 refills | Status: DC
  Filled 2021-04-30: qty 60, 30d supply, fill #0
  Filled 2021-05-25: qty 60, 30d supply, fill #1
  Filled 2021-06-25: qty 60, 30d supply, fill #2

## 2021-04-30 MED ORDER — LISINOPRIL 10 MG PO TABS
10.0000 mg | ORAL_TABLET | Freq: Every day | ORAL | 3 refills | Status: DC
  Filled 2021-04-30: qty 90, 90d supply, fill #0
  Filled 2021-07-29: qty 30, 30d supply, fill #1
  Filled 2021-08-26: qty 30, 30d supply, fill #0
  Filled 2021-08-26: qty 30, 30d supply, fill #2

## 2021-04-30 MED ORDER — GABAPENTIN 300 MG PO CAPS
ORAL_CAPSULE | Freq: Three times a day (TID) | ORAL | 1 refills | Status: DC
  Filled 2021-04-30: qty 90, 30d supply, fill #0
  Filled 2021-06-25: qty 90, 30d supply, fill #1
  Filled 2021-07-29: qty 90, 30d supply, fill #2
  Filled 2021-08-26: qty 90, 30d supply, fill #0
  Filled 2021-08-26: qty 90, 30d supply, fill #3
  Filled 2021-11-03: qty 90, 30d supply, fill #1
  Filled 2021-12-02: qty 90, 30d supply, fill #2

## 2021-04-30 NOTE — Patient Instructions (Signed)
Check blood pressure daily and record.   

## 2021-04-30 NOTE — Progress Notes (Signed)
Patient ID: Anthony Schmidt, male   DOB: 1981-06-27, 40 y.o.   MRN: [id number]   Anthony Schmidt, is a 39 y.o. male  UOR:[id number]  EXM:[id number]  DOB - [birth year]  Chief Complaint  Patient presents with   Diabetes       Subjective:   Anthony Schmidt is a 40 y.o. male here today for med RF.  Patient has some headache, No chest pain, No abdominal pain - No Nausea, No new weakness tingling or numbness, No Cough - SOB. He is completely out of lisinopril 43m.  He is complaint with his diabetes regimen.  Out of cholesterol meds.    No problems updated.  ALLERGIES: No Known Allergies  PAST MEDICAL HISTORY: Past Medical History:  Diagnosis Date   Diabetes mellitus, new onset ([place] 01/[place]    MEDICATIONS AT HOME: Prior to Admission medications   Medication Sig Start Date End Date Taking? Authorizing Provider  acetaminophen (TYLENOL) 500 MG tablet Take 1,000-2,000 mg by mouth daily as needed for headache (pain).   Yes [provider]  blood glucose meter kit and supplies KIT Dispense based on patient and insurance preference. Use up to four times daily as directed. (FOR ICD-9 250.00, 250.01). 01/20/20  Yes Regalado, Belkys A, MD  Blood Glucose Monitoring Suppl (TRUE METRIX METER) w/Device KIT Use as instructed to check blood sugar TID. 02/22/20  Yes Newlin, Enobong, MD  dextrose (GLUTOSE) 40 % GEL Take 37.5 g by mouth once as needed for up to 1 dose for low blood sugar. 04/24/20  Yes FGildardo Pounds NP  glucose blood (TRUE METRIX BLOOD GLUCOSE TEST) test strip Use as instructed to check blood sugar TID. 02/22/20  Yes Newlin, Enobong, MD  ibuprofen (ADVIL) 600 MG tablet TAKE 1 TABLET (600 MG TOTAL) BY MOUTH EVERY 8 (EIGHT) HOURS AS NEEDED. 02/18/21 02/18/22 Yes Leda Bellefeuille M, PA-C  Insulin Pen Needle (TRUEPLUS PEN NEEDLES) 31G X 5 MM MISC USE 2 TIMES A DAY AS DIRECTED 04/28/21  Yes Newlin, Enobong, MD  lisinopril (ZESTRIL) 10 MG tablet Take 1 tablet (10 mg total) by mouth daily. 04/30/21  Yes  MFreeman CaldronM, PA-C  TRUEplus Lancets 28G MISC Use as instructed to check blood sugar TID. 02/22/20  Yes Newlin, Enobong, MD  atorvastatin (LIPITOR) 20 MG tablet TAKE 1 TABLET (20 MG TOTAL) BY MOUTH DAILY. 04/30/21 07/29/21  MArgentina Donovan PA-C  gabapentin (NEURONTIN) 300 MG capsule TAKE 1 CAPSULE (300 MG TOTAL) BY MOUTH 3 (THREE) TIMES DAILY. 04/30/21 04/30/22  MArgentina Donovan PA-C  insulin isophane & regular human KwikPen (HUMULIN 70/30 MIX) (70-30) 100 UNIT/ML KwikPen INJECT 10 UNITS INTO THE SKIN 2 (TWO) TIMES DAILY. 04/30/21 04/30/22  MArgentina Donovan PA-C  Insulin Pen Needle (TRUEPLUS PEN NEEDLES) 31G X 5 MM MISC USE 2 TIMES A DAY AS DIRECTED 04/30/21   MArgentina Donovan PA-C  metFORMIN (GLUCOPHAGE-XR) 500 MG 24 hr tablet TAKE 1 TABLET (500 MG TOTAL) BY MOUTH IN THE MORNING AND AT BEDTIME. 04/30/21 04/30/22  Areeb Corron, ADionne Bucy PA-C    ROS: Neg HEENT Neg resp Neg cardiac Neg GI Neg GU Neg MS Neg psych Neg neuro  Objective:   Vitals:   04/30/21 1621  BP: (!) 158/104  Pulse: 78  SpO2: 98%  Weight: 229 lb 6 oz (104 kg)  Height: _0  (1.905 m)   Exam General appearance : Awake, alert, not in any distress. Speech Clear. Not toxic looking HEENT: Atraumatic and Normocephalic Neck: Supple, no JVD. No cervical lymphadenopathy.  Chest: Good air entry bilaterally, CTAB.  No rales/rhonchi/wheezing CVS: S1 S2 regular, no murmurs.  Extremities: B/L Lower Ext shows no edema, both legs are warm to touch Neurology: Awake alert, and oriented X 3, CN II-XII intact, Non focal Skin: No Rash  Data Review Lab Results  Component Value Date   HGBA1C 6.2 (A) 04/30/2021   HGBA1C 6.7 (H) 04/07/2020   HGBA1C 14.9 (H) 01/17/2020    Assessment & Plan   1. Type 2 diabetes mellitus with hyperglycemia, with long-term current use of insulin (HCC) Improved-continue current regimen and continue to work on diet - Glucose (CBG) - HgB A1c - Comprehensive metabolic panel - Lipid panel - CBC  with Differential/Platelet - metFORMIN (GLUCOPHAGE-XR) 500 MG 24 hr tablet; TAKE 1 TABLET (500 MG TOTAL) BY MOUTH IN THE MORNING AND AT BEDTIME.  Dispense: 180 tablet; Refill: 0 - insulin isophane & regular human KwikPen (HUMULIN 70/30 MIX) (70-30) 100 UNIT/ML KwikPen; INJECT 10 UNITS INTO THE SKIN 2 (TWO) TIMES DAILY.  Dispense: 15 mL; Refill: 11  2. Hypertension, unspecified type Out of lisinopril 39m, he has been having HA and says BP high at home even when he was taking lisinopril 575m  Increase dose.  Check BP daily and record and f/up 3 weeks televisit with numbers - Comprehensive metabolic panel - CBC with Differential/Platelet - lisinopril (ZESTRIL) 10 MG tablet; Take 1 tablet (10 mg total) by mouth daily.  Dispense: 90 tablet; Refill: 3  3. Neuropathy stable - gabapentin (NEURONTIN) 300 MG capsule; TAKE 1 CAPSULE (300 MG TOTAL) BY MOUTH 3 (THREE) TIMES DAILY.  Dispense: 270 capsule; Refill: 1  4. Dyslipidemia, goal LDL below 70 - Comprehensive metabolic panel - Lipid panel - atorvastatin (LIPITOR) 20 MG tablet; TAKE 1 TABLET (20 MG TOTAL) BY MOUTH DAILY.  Dispense: 90 tablet; Refill: 0    Patient have been counseled extensively about nutrition and exercise. Other issues discussed during this visit include: low cholesterol diet, weight control and daily exercise, foot care, annual eye examinations at Ophthalmology, importance of adherence with medications and regular follow-up. We also discussed long term complications of uncontrolled diabetes and hypertension.   Return for 3 weeks for televisit for BP check;  3 months with PCP.  The patient was given clear instructions to go to ER or return to medical center if symptoms don't improve, worsen or new problems develop. The patient verbalized understanding. The patient was told to call to get lab results if they haven't heard anything in the next week.      AnFreeman CaldronPA-C [facility] WeH[facility] VillagerDeversNCMerlin 04/30/2021, 4:51 PM

## 2021-05-01 ENCOUNTER — Other Ambulatory Visit: Payer: Self-pay

## 2021-05-01 LAB — CBC WITH DIFFERENTIAL/PLATELET
Basophils Absolute: 0 10*3/uL (ref 0.0–0.2)
Basos: 1 %
EOS (ABSOLUTE): 0.2 10*3/uL (ref 0.0–0.4)
Eos: 4 %
Hematocrit: 38.9 % (ref 37.5–51.0)
Hemoglobin: 12.9 g/dL — ABNORMAL LOW (ref 13.0–17.7)
Immature Grans (Abs): 0 10*3/uL (ref 0.0–0.1)
Immature Granulocytes: 0 %
Lymphocytes Absolute: 2 10*3/uL (ref 0.7–3.1)
Lymphs: 39 %
MCH: 30.1 pg (ref 26.6–33.0)
MCHC: 33.2 g/dL (ref 31.5–35.7)
MCV: 91 fL (ref 79–97)
Monocytes Absolute: 0.4 10*3/uL (ref 0.1–0.9)
Monocytes: 8 %
Neutrophils Absolute: 2.5 10*3/uL (ref 1.4–7.0)
Neutrophils: 48 %
Platelets: 177 10*3/uL (ref 150–450)
RBC: 4.29 x10E6/uL (ref 4.14–5.80)
RDW: 13.2 % (ref 11.6–15.4)
WBC: 5.1 10*3/uL (ref 3.4–10.8)

## 2021-05-01 LAB — COMPREHENSIVE METABOLIC PANEL
ALT: 26 IU/L (ref 0–44)
AST: 30 IU/L (ref 0–40)
Albumin/Globulin Ratio: 1.7 (ref 1.2–2.2)
Albumin: 4.3 g/dL (ref 4.0–5.0)
Alkaline Phosphatase: 75 IU/L (ref 44–121)
BUN/Creatinine Ratio: 13 (ref 9–20)
BUN: 13 mg/dL (ref 6–24)
Bilirubin Total: 0.5 mg/dL (ref 0.0–1.2)
CO2: 21 mmol/L (ref 20–29)
Calcium: 9.1 mg/dL (ref 8.7–10.2)
Chloride: 101 mmol/L (ref 96–106)
Creatinine, Ser: 0.99 mg/dL (ref 0.76–1.27)
Globulin, Total: 2.6 g/dL (ref 1.5–4.5)
Glucose: 93 mg/dL (ref 65–99)
Potassium: 3.9 mmol/L (ref 3.5–5.2)
Sodium: 140 mmol/L (ref 134–144)
Total Protein: 6.9 g/dL (ref 6.0–8.5)
eGFR: 98 mL/min/{1.73_m2} (ref >59)

## 2021-05-01 LAB — LIPID PANEL
Chol/HDL Ratio: 4.9 ratio (ref 0.0–5.0)
Cholesterol, Total: 172 mg/dL (ref 100–199)
HDL: 35 mg/dL — ABNORMAL LOW (ref >39)
LDL Chol Calc (NIH): 72 mg/dL (ref 0–99)
Triglycerides: 415 mg/dL — ABNORMAL HIGH (ref 0–149)
VLDL Cholesterol Cal: 65 mg/dL — ABNORMAL HIGH (ref 5–40)

## 2021-05-25 ENCOUNTER — Other Ambulatory Visit: Payer: Self-pay | Admitting: Physician Assistant

## 2021-05-25 DIAGNOSIS — M79675 Pain in left toe(s): Secondary | ICD-10-CM

## 2021-05-25 DIAGNOSIS — M79674 Pain in right toe(s): Secondary | ICD-10-CM

## 2021-05-25 MED ORDER — IBUPROFEN 600 MG PO TABS
ORAL_TABLET | Freq: Three times a day (TID) | ORAL | 1 refills | Status: DC | PRN
  Filled 2021-05-25: qty 60, 20d supply, fill #0
  Filled 2021-06-25: qty 60, 20d supply, fill #1

## 2021-05-25 NOTE — Telephone Encounter (Signed)
Requested Prescriptions  Pending Prescriptions Disp Refills  . ibuprofen (ADVIL) 600 MG tablet 60 tablet 1    Sig: TAKE 1 TABLET (600 MG TOTAL) BY MOUTH EVERY 8 (EIGHT) HOURS AS NEEDED.     Analgesics:  NSAIDS Failed - 05/25/2021 12:38 PM      Failed - HGB in normal range and within 360 days    Hemoglobin  Date Value Ref Range Status  04/30/2021 12.9 (L) 13.0 - 17.7 g/dL Final         Passed - Cr in normal range and within 360 days    Creatinine, Ser  Date Value Ref Range Status  04/30/2021 0.99 0.76 - 1.27 mg/dL Final         Passed - Patient is not pregnant      Passed - Valid encounter within last 12 months    Recent Outpatient Visits          3 weeks ago Type 2 diabetes mellitus with hyperglycemia, with long-term current use of insulin [facility])   [place] [facility] [place], [place], [place]   10 months ago Neuropathy   [facility] [place], [place], [place]   1 year ago Type 2 diabetes mellitus with hyperglycemia, with long-term current use of insulin [facility])   [place] [facility] [place], Shea Stakes, NP   1 year ago Syncope and collapse   [facility] [place], [place], [place]   1 year ago Type 2 diabetes mellitus without complication, with long-term current use of insulin [facility])   [facility] [place], Cornelius Moras, RPH-CPP

## 2021-05-26 ENCOUNTER — Other Ambulatory Visit: Payer: Self-pay

## 2021-05-27 ENCOUNTER — Other Ambulatory Visit: Payer: Self-pay

## 2021-06-09 IMAGING — MR MR HEAD W/O CM
12 of 13 series · 44 of 48 positions shown · non-contrast
Comparison: None.

CLINICAL DATA: Vertigo

EXAM:
MRI HEAD WITHOUT CONTRAST
TECHNIQUE: Multiplanar, multiecho pulse sequences of the brain and surrounding
structures were obtained without intravenous contrast.

[Series 5: DWI · axial · 3.0mm · 0.88mm/px · z∈[-65,+82]mm · 8 of 100 slices shown (1 of 4)]
[im 1/100]
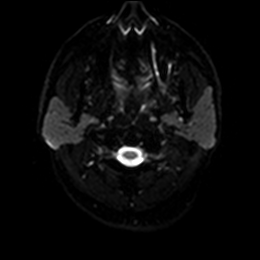
[im 15/100]
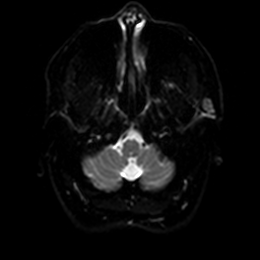
[im 29/100]
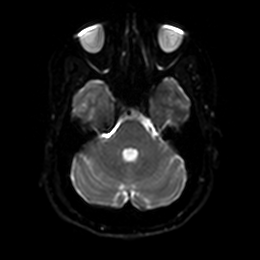
[im 43/100]
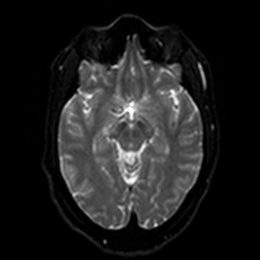
[im 57/100]
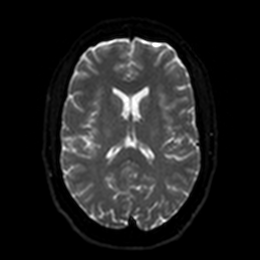
[im 71/100]
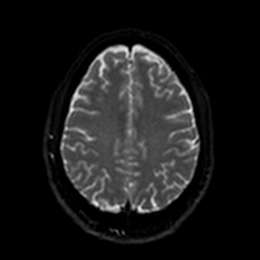
[im 85/100]
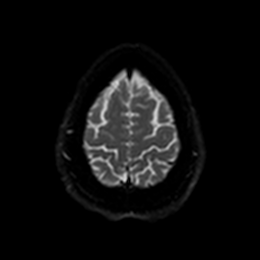
[im 100/100]
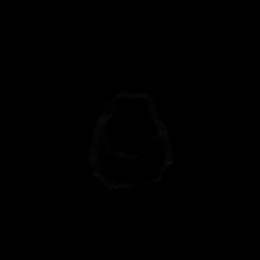

[Series 6: DWI · axial · 3.0mm · 0.88mm/px · z∈[-65,+82]mm · 4 of 50 slices shown (2 of 4)]
[im 1/50]
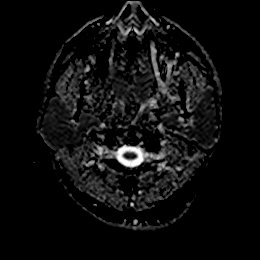
[im 17/50]
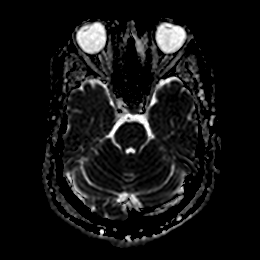
[im 33/50]
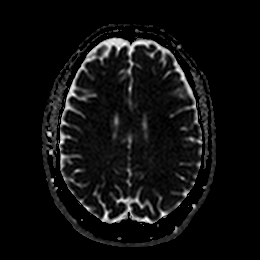
[im 50/50]
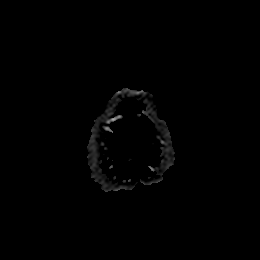

[Series 8: DWI · coronal · 4.0mm · 0.88mm/px · 5 of 72 slices shown (3 of 4)]
[im 1/72]
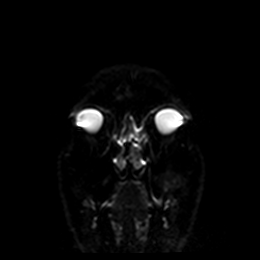
[im 18/72]
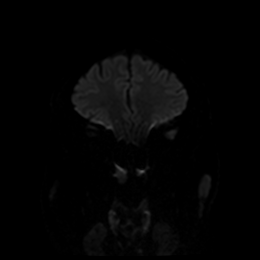
[im 36/72]
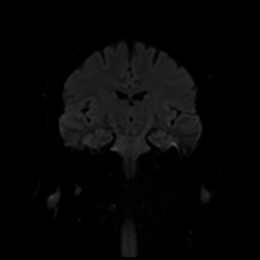
[im 54/72]
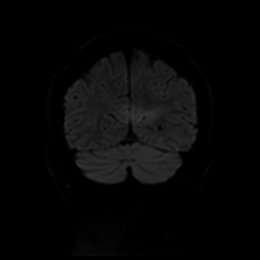
[im 72/72]
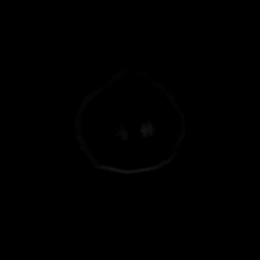

[Series 9: DWI · coronal · 4.0mm · 0.88mm/px · 3 of 36 slices shown (4 of 4)]
[im 1/36]
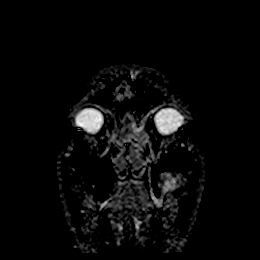
[im 18/36]
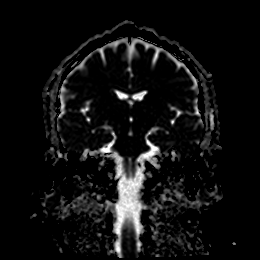
[im 36/36]
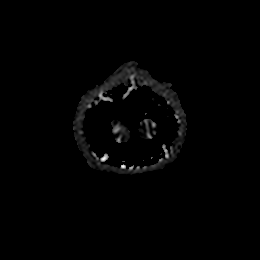

[Series 10: T1 · sagittal · 5.0mm · 0.75mm/px · 2 of 25 slices shown]
[im 1/25]
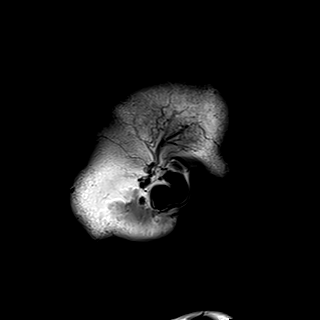
[im 25/25]
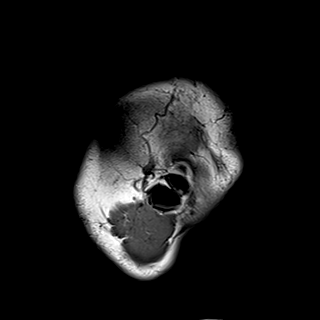

[Series 11: T2 · axial · 5.0mm · 0.60mm/px · z∈[-62,+81]mm · 2 of 25 slices shown (1 of 2)]
[im 1/25]
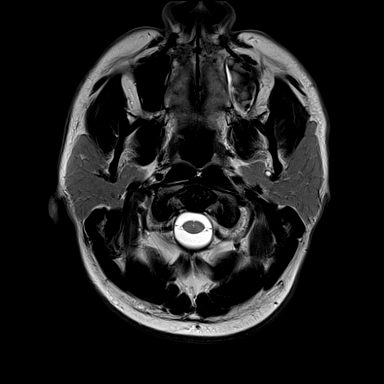
[im 25/25]
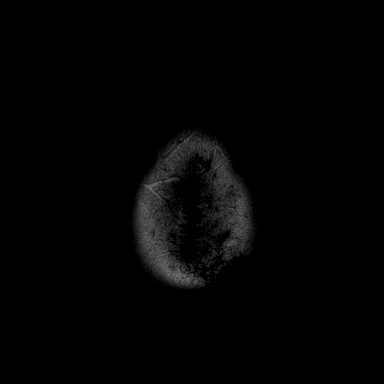

[Series 13: FLAIR · axial · 5.0mm · 0.80mm/px · z∈[-62,+81]mm · 2 of 25 slices shown]
[im 1/25]
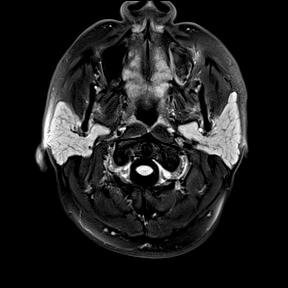
[im 25/25]
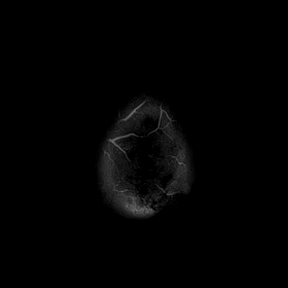

[Series 14: mag_images · axial · 3.0mm · 0.90mm/px · z∈[-73,+91]mm · 4 of 56 slices shown]
[im 1/56]
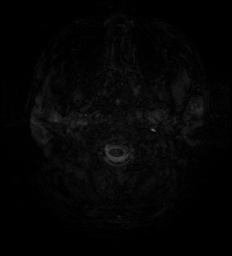
[im 19/56]
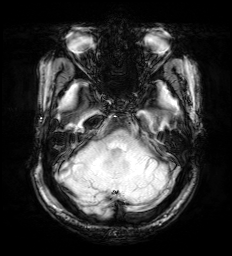
[im 37/56]
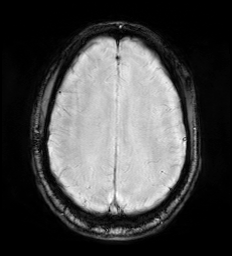
[im 56/56]
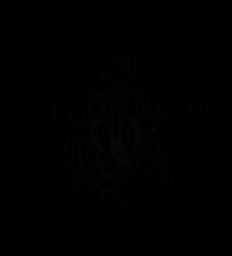

[Series 15: pha_images · axial · 3.0mm · 0.90mm/px · z∈[-73,+91]mm · 4 of 55 slices shown]
[im 1/55]
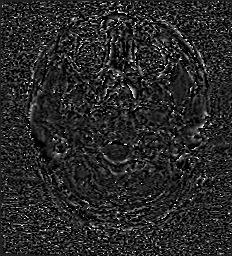
[im 19/55]
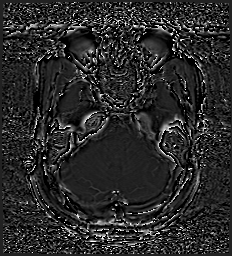
[im 37/55]
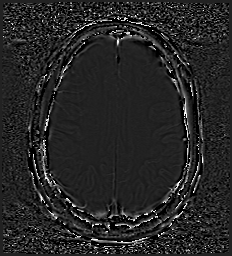
[im 55/55]
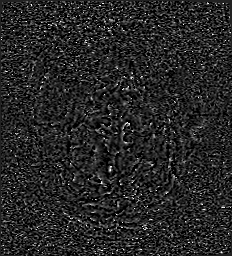

[Series 16: swi_images · axial · 3.0mm · 0.90mm/px · z∈[-73,+91]mm · 4 of 56 slices shown]
[im 1/56]
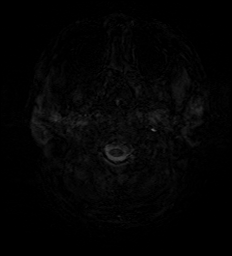
[im 19/56]
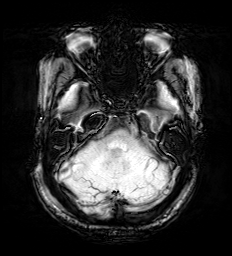
[im 37/56]
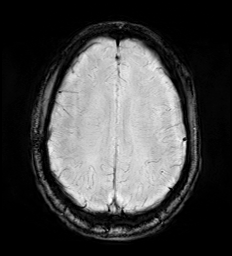
[im 56/56]
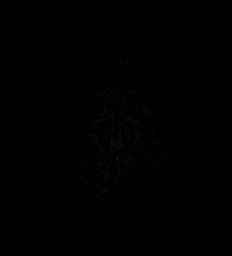

[Series 17: mip_images(sw) · axial · 24.0mm · 0.90mm/px · z∈[-63,+81]mm · 4 of 49 slices shown]
[im 1/49]
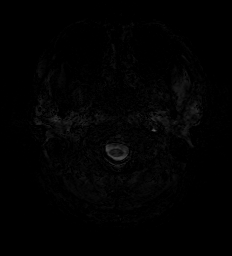
[im 17/49]
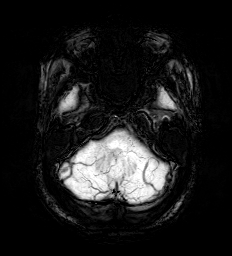
[im 33/49]
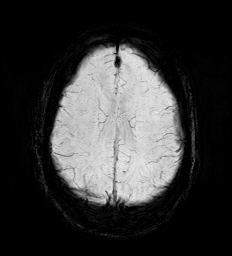
[im 49/49]
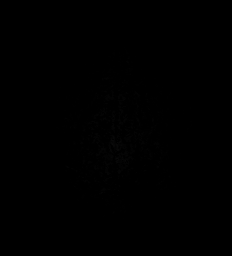

[Series 19: T2 · coronal · 5.0mm · 0.34mm/px · 2 of 32 slices shown (2 of 2)]
[im 1/32]
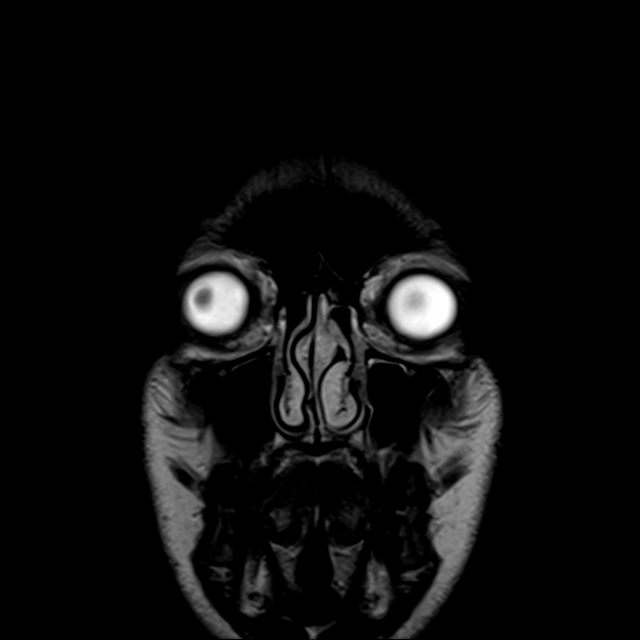
[im 32/32]
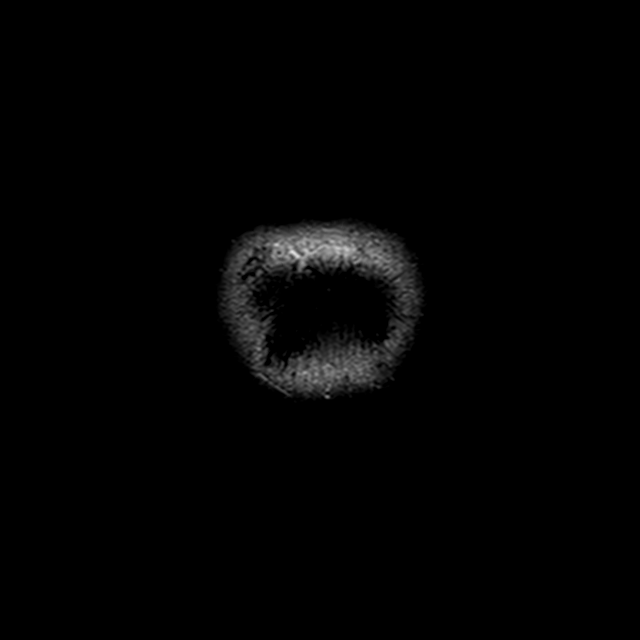

[44 of 48 positions shown; findings below may reference images not displayed]

FINDINGS: BRAIN: No acute infarct, acute hemorrhage or extra-axial collection.
Normal white matter signal. Normal volume of CSF spaces. No chronic
microhemorrhage. Normal midline structures.

VASCULAR: Major flow voids are preserved.

SKULL AND UPPER CERVICAL SPINE: Normal calvarium and skull base.
Visualized upper cervical spine and soft tissues are normal.

SINUSES/ORBITS: No paranasal sinus fluid levels or advanced mucosal
thickening. No mastoid or middle ear effusion. Normal orbits.
IMPRESSION: Normal brain MRI.

## 2021-06-25 ENCOUNTER — Telehealth (INDEPENDENT_AMBULATORY_CARE_PROVIDER_SITE_OTHER): Payer: Self-pay

## 2021-06-25 NOTE — Telephone Encounter (Signed)
Copied from CRM [id number]. 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 Pt would like to know if he can just bring the paperwork in without scheduling an appt. Pt requests call back. Cb# [phone]

## 2021-06-26 ENCOUNTER — Other Ambulatory Visit: Payer: Self-pay

## 2021-06-26 ENCOUNTER — Telehealth: Payer: Self-pay | Admitting: Nurse Practitioner

## 2021-06-26 NOTE — Telephone Encounter (Signed)
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

## 2021-06-26 NOTE — Telephone Encounter (Signed)
Are you able to fill out paper work and if so when does he need an appt.

## 2021-06-27 ENCOUNTER — Telehealth (HOSPITAL_BASED_OUTPATIENT_CLINIC_OR_DEPARTMENT_OTHER): Payer: Self-pay | Admitting: Nurse Practitioner

## 2021-06-27 ENCOUNTER — Telehealth: Payer: Self-pay

## 2021-06-27 ENCOUNTER — Encounter: Payer: Self-pay | Admitting: Nurse Practitioner

## 2021-06-27 DIAGNOSIS — Z794 Long term (current) use of insulin: Secondary | ICD-10-CM

## 2021-06-27 DIAGNOSIS — E1165 Type 2 diabetes mellitus with hyperglycemia: Secondary | ICD-10-CM

## 2021-06-27 DIAGNOSIS — Z0289 Encounter for other administrative examinations: Secondary | ICD-10-CM

## 2021-06-27 NOTE — Telephone Encounter (Signed)
Paperwork has been complete and patient will pick up paperwork.

## 2021-06-27 NOTE — Telephone Encounter (Signed)
Patient Anthony Schmidt dropped off [employer] form to be filled out by Dr. Meredeth Ide. Gave form to her and she said will fill out and have ready by Monday

## 2021-06-27 NOTE — Progress Notes (Signed)
Virtual Visit via Telephone Note Due to national recommendations of social distancing due to COVID 19, telehealth visit is felt to be most appropriate for this patient at this time.  I discussed the limitations, risks, security and privacy concerns of performing an evaluation and management service by telephone and the availability of in person appointments. I also discussed with the patient that there may be a patient responsible charge related to this service. The patient expressed understanding and agreed to proceed.    I connected with Anthony Schmidt on 06/27/21  at   8:10 AM EST  EDT by telephone and verified that I am speaking with the correct person using two identifiers.  Location of Patient: Private Residence   Location of Provider: [facility]    Persons participating in Telemedicine visit: Anthony Schmidt Anthony Schmidt    History of Present Illness: Telemedicine visit for: CDL paperwork  Needs paperwork signed and notes submitted. 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  I have discussed this with Anthony Schmidt and paperwork has been completed at this time.   Past Medical History:  Diagnosis Date   Diabetes mellitus, new onset (HCC) 01/2020    Past Surgical History:  Procedure Laterality Date   KNEE ARTHROSCOPY Bilateral    KNEE SURGERY Bilateral     Family History  Problem Relation Age of Onset   Diabetes Mother    Hypertension Mother    Diabetes Father    Hypertension Father     Social History   Socioeconomic History   Marital status: Significant Other    Spouse name: Not on file   Number of children: Not on file   Years of education: Not on file   Highest education level: Not on file  Occupational History   Not on file  Tobacco Use   Smoking status: Never   Smokeless tobacco: Never   Vaping Use   Vaping Use: Never used  Substance and Sexual Activity   Alcohol use: Yes    Alcohol/week: 6.0 standard drinks    Types: 6 Cans of beer per week    Comment: occassionally   Drug use: Never   Sexual activity: Yes  Other Topics Concern   Not on file  Social History Narrative   Not on file   Social Determinants of Health   Financial Resource Strain: Not on file  Food Insecurity: Not on file  Transportation Needs: Not on file  Physical Activity: Not on file  Stress: Not on file  Social Connections: Not on file     Observations/Objective: Awake, alert and oriented x 3   Review of Systems  Constitutional:  Negative for fever, malaise/fatigue and weight loss.  HENT: Negative.  Negative for nosebleeds.   Eyes: Negative.  Negative for blurred vision, double vision and photophobia.  Respiratory: Negative.  Negative for cough and shortness of breath.   Cardiovascular: Negative.  Negative for chest pain, palpitations and leg swelling.  Gastrointestinal: Negative.  Negative for heartburn, nausea and vomiting.  Musculoskeletal: Negative.  Negative for myalgias.  Neurological: Negative.  Negative for dizziness, focal weakness, seizures and headaches.  Psychiatric/Behavioral: Negative.  Negative for suicidal ideas.    Assessment and Plan: Diagnoses and all orders for this visit:  Type 2 diabetes mellitus with hyperglycemia, with long-term current use of insulin (HCC) Encounter for completion of form with  patient    Follow Up Instructions Return if symptoms worsen or fail to improve.     I discussed the assessment and treatment plan with the patient. The patient was provided an opportunity to ask questions and all were answered. The patient agreed with the plan and demonstrated an understanding of the instructions.   The patient was advised to call back or seek an in-person evaluation if the symptoms worsen or if the condition fails to improve as anticipated.  I  provided 5 minutes of non-face-to-face time during this encounter including median intraservice time, reviewing previous notes, labs, imaging, medications and explaining diagnosis and management.  Claiborne Rigg, Schmidt

## 2021-06-27 NOTE — Telephone Encounter (Signed)
Please inform pt we will contact when papers are ready for pick up.

## 2021-07-15 ENCOUNTER — Other Ambulatory Visit: Payer: Self-pay

## 2021-07-16 ENCOUNTER — Other Ambulatory Visit: Payer: Self-pay

## 2021-07-29 ENCOUNTER — Other Ambulatory Visit: Payer: Self-pay | Admitting: Nurse Practitioner

## 2021-07-29 ENCOUNTER — Other Ambulatory Visit: Payer: Self-pay | Admitting: Physician Assistant

## 2021-07-29 ENCOUNTER — Other Ambulatory Visit: Payer: Self-pay

## 2021-07-29 DIAGNOSIS — M79675 Pain in left toe(s): Secondary | ICD-10-CM

## 2021-07-29 DIAGNOSIS — E1165 Type 2 diabetes mellitus with hyperglycemia: Secondary | ICD-10-CM

## 2021-07-29 DIAGNOSIS — E785 Hyperlipidemia, unspecified: Secondary | ICD-10-CM

## 2021-07-29 DIAGNOSIS — M79674 Pain in right toe(s): Secondary | ICD-10-CM

## 2021-07-29 MED ORDER — ATORVASTATIN CALCIUM 20 MG PO TABS
ORAL_TABLET | Freq: Every day | ORAL | 0 refills | Status: DC
  Filled 2021-07-29: qty 30, 30d supply, fill #0
  Filled 2021-08-26: qty 30, 30d supply, fill #1
  Filled 2021-08-26: qty 30, 30d supply, fill #0

## 2021-07-29 MED ORDER — IBUPROFEN 600 MG PO TABS
ORAL_TABLET | Freq: Three times a day (TID) | ORAL | 0 refills | Status: DC | PRN
Start: 2021-07-29 — End: 2022-01-14
  Filled 2021-07-29: qty 60, 20d supply, fill #0

## 2021-07-29 NOTE — Telephone Encounter (Signed)
Requested medication (s) are due for refill today: yes  Requested medication (s) are on the active medication list: yes  Last refill:  04/30/21 #180/0RF  Future visit scheduled: NO  Notes to clinic:  .Unable to refill per protocol due to failed labs, no updated results.      Requested Prescriptions  Pending Prescriptions Disp Refills   metFORMIN (GLUCOPHAGE-XR) 500 MG 24 hr tablet 180 tablet 0    Sig: TAKE 1 TABLET (500 MG TOTAL) BY MOUTH IN THE MORNING AND AT BEDTIME.     Endocrinology:  Diabetes - Biguanides Failed - 07/29/2021 11:39 AM      Failed - AA eGFR in normal range and within 360 days    GFR calc Af Amer  Date Value Ref Range Status  04/08/2020 >60 >60 mL/min Final   GFR calc non Af Amer  Date Value Ref Range Status  04/08/2020 >60 >60 mL/min Final   eGFR  Date Value Ref Range Status  04/30/2021 98 >59 mL/min/1.73 Final          Passed - Cr in normal range and within 360 days    Creatinine, Ser  Date Value Ref Range Status  04/30/2021 0.99 0.76 - 1.27 mg/dL Final          Passed - HBA1C is between 0 and 7.9 and within 180 days    Hemoglobin A1C  Date Value Ref Range Status  04/30/2021 6.2 (A) 4.0 - 5.6 % Final   Hgb A1c MFr Bld  Date Value Ref Range Status  04/07/2020 6.7 (H) 4.8 - 5.6 % Final    Comment:    (NOTE) Pre diabetes:          5.7%-6.4%  Diabetes:              >6.4%  Glycemic control for   <7.0% adults with diabetes           Passed - Valid encounter within last 6 months    Recent Outpatient Visits           1 month ago Type 2 diabetes mellitus with hyperglycemia, with long-term current use of insulin ([place])   [place], [place] W, NP   3 months ago Type 2 diabetes mellitus with hyperglycemia, with long-term current use of insulin [facility])   [place], [place], [place]   1 year ago Neuropathy   [place], [place], [place]    1 year ago Type 2 diabetes mellitus with hyperglycemia, with long-term current use of insulin [facility])   [place], Vernia Buff, NP   1 year ago Syncope and collapse   [place], [place], [place]              Signed Prescriptions Disp Refills   atorvastatin (LIPITOR) 20 MG tablet 90 tablet 0    Sig: TAKE 1 TABLET (20 MG TOTAL) BY MOUTH DAILY.     Cardiovascular:  Antilipid - Statins Failed - 07/29/2021 11:39 AM      Failed - HDL in normal range and within 360 days    HDL  Date Value Ref Range Status  04/30/2021 35 (L) >39 mg/dL Final          Failed - Triglycerides in normal range and within 360 days    Triglycerides  Date Value Ref Range Status  04/30/2021 415 (H) 0 -  149 mg/dL Final          Passed - Total Cholesterol in normal range and within 360 days    Cholesterol, Total  Date Value Ref Range Status  04/30/2021 172 100 - 199 mg/dL Final          Passed - LDL in normal range and within 360 days    LDL Chol Calc (NIH)  Date Value Ref Range Status  04/30/2021 72 0 - 99 mg/dL Final          Passed - Patient is not pregnant      Passed - Valid encounter within last 12 months    Recent Outpatient Visits           1 month ago Type 2 diabetes mellitus with hyperglycemia, with long-term current use of insulin ([place])   [place], [place] W, NP   3 months ago Type 2 diabetes mellitus with hyperglycemia, with long-term current use of insulin [facility])   [place], [place], [place]   1 year ago Neuropathy   [place], [place], [place]   1 year ago Type 2 diabetes mellitus with hyperglycemia, with long-term current use of insulin [facility])   [place], Vernia Buff, NP   1 year ago Syncope and collapse   [place],  [place], [place]

## 2021-07-29 NOTE — Telephone Encounter (Signed)
Requested Prescriptions  Pending Prescriptions Disp Refills   metFORMIN (GLUCOPHAGE-XR) 500 MG 24 hr tablet 180 tablet 0    Sig: TAKE 1 TABLET (500 MG TOTAL) BY MOUTH IN THE MORNING AND AT BEDTIME.     Endocrinology:  Diabetes - Biguanides Failed - 07/29/2021 11:39 AM      Failed - AA eGFR in normal range and within 360 days    GFR calc Af Amer  Date Value Ref Range Status  04/08/2020 >60 >60 mL/min Final   GFR calc non Af Amer  Date Value Ref Range Status  04/08/2020 >60 >60 mL/min Final   eGFR  Date Value Ref Range Status  04/30/2021 98 >59 mL/min/1.73 Final         Passed - Cr in normal range and within 360 days    Creatinine, Ser  Date Value Ref Range Status  04/30/2021 0.99 0.76 - 1.27 mg/dL Final         Passed - HBA1C is between 0 and 7.9 and within 180 days    Hemoglobin A1C  Date Value Ref Range Status  04/30/2021 6.2 (A) 4.0 - 5.6 % Final   Hgb A1c MFr Bld  Date Value Ref Range Status  04/07/2020 6.7 (H) 4.8 - 5.6 % Final    Comment:    (NOTE) Pre diabetes:          5.7%-6.4%  Diabetes:              >6.4%  Glycemic control for   <7.0% adults with diabetes          Passed - Valid encounter within last 6 months    Recent Outpatient Visits          1 month ago Type 2 diabetes mellitus with hyperglycemia, with long-term current use of insulin ([place])   [place], [place] W, NP   3 months ago Type 2 diabetes mellitus with hyperglycemia, with long-term current use of insulin [facility])   [place], [place], [place]   1 year ago Neuropathy   [place], Charlane Ferretti, MD   1 year ago Type 2 diabetes mellitus with hyperglycemia, with long-term current use of insulin [facility])   [place], [place] W, NP   1 year ago Syncope and collapse   [place], [place], [place]               atorvastatin (LIPITOR) 20 MG tablet 90 tablet 0    Sig: TAKE 1 TABLET (20 MG TOTAL) BY MOUTH DAILY.     Cardiovascular:  Antilipid - Statins Failed - 07/29/2021 11:39 AM      Failed - HDL in normal range and within 360 days    HDL  Date Value Ref Range Status  04/30/2021 35 (L) >39 mg/dL Final         Failed - Triglycerides in normal range and within 360 days    Triglycerides  Date Value Ref Range Status  04/30/2021 415 (H) 0 - 149 mg/dL Final         Passed - Total Cholesterol in normal range and within 360 days    Cholesterol, Total  Date Value Ref Range Status  04/30/2021 172 100 - 199 mg/dL Final         Passed - LDL in normal range and within 360 days    LDL Chol  Calc (NIH)  Date Value Ref Range Status  04/30/2021 72 0 - 99 mg/dL Final         Passed - Patient is not pregnant      Passed - Valid encounter within last 12 months    Recent Outpatient Visits          1 month ago Type 2 diabetes mellitus with hyperglycemia, with long-term current use of insulin [facility])   [place], [place] W, NP   3 months ago Type 2 diabetes mellitus with hyperglycemia, with long-term current use of insulin [facility])   [place], [place], [place]   1 year ago Neuropathy   [place], Charlane Ferretti, MD   1 year ago Type 2 diabetes mellitus with hyperglycemia, with long-term current use of insulin [facility])   [place], Vernia Buff, NP   1 year ago Syncope and collapse   [place], [place], [place]

## 2021-07-29 NOTE — Telephone Encounter (Signed)
Requested Prescriptions  Pending Prescriptions Disp Refills   ibuprofen (ADVIL) 600 MG tablet 60 tablet 0    Sig: TAKE 1 TABLET (600 MG TOTAL) BY MOUTH EVERY 8 (EIGHT) HOURS AS NEEDED.     Analgesics:  NSAIDS Failed - 07/29/2021 11:39 AM      Failed - HGB in normal range and within 360 days    Hemoglobin  Date Value Ref Range Status  04/30/2021 12.9 (L) 13.0 - 17.7 g/dL Final         Passed - Cr in normal range and within 360 days    Creatinine, Ser  Date Value Ref Range Status  04/30/2021 0.99 0.76 - 1.27 mg/dL Final         Passed - Patient is not pregnant      Passed - Valid encounter within last 12 months    Recent Outpatient Visits          1 month ago Type 2 diabetes mellitus with hyperglycemia, with long-term current use of insulin (HCC)   [facility] [place], [place], NP   3 months ago Type 2 diabetes mellitus with hyperglycemia, with long-term current use of insulin [facility])   [place] [facility] [place], [place], [place]   1 year ago Neuropathy   [place] [place], [place], [place]   1 year ago Type 2 diabetes mellitus with hyperglycemia, with long-term current use of insulin [facility])   [place] [facility] [place], Shea Stakes, NP   1 year ago Syncope and collapse   [facility] Wellness [place], [place], [place]

## 2021-07-30 ENCOUNTER — Other Ambulatory Visit: Payer: Self-pay

## 2021-07-30 MED ORDER — METFORMIN HCL ER 500 MG PO TB24
ORAL_TABLET | ORAL | 2 refills | Status: DC
  Filled 2021-07-30: qty 60, 30d supply, fill #0
  Filled 2021-08-26: qty 60, 30d supply, fill #1
  Filled 2021-08-26: qty 60, 30d supply, fill #0
  Filled 2021-09-21: qty 60, 30d supply, fill #1

## 2021-08-01 ENCOUNTER — Other Ambulatory Visit: Payer: Self-pay

## 2021-08-26 ENCOUNTER — Other Ambulatory Visit: Payer: Self-pay

## 2021-08-27 ENCOUNTER — Other Ambulatory Visit: Payer: Self-pay

## 2021-09-09 ENCOUNTER — Other Ambulatory Visit: Payer: Self-pay

## 2021-09-10 ENCOUNTER — Other Ambulatory Visit: Payer: Self-pay

## 2021-09-10 ENCOUNTER — Ambulatory Visit: Payer: Self-pay | Attending: Physician Assistant | Admitting: Physician Assistant

## 2021-09-10 DIAGNOSIS — I10 Essential (primary) hypertension: Secondary | ICD-10-CM

## 2021-09-10 DIAGNOSIS — Z794 Long term (current) use of insulin: Secondary | ICD-10-CM

## 2021-09-10 DIAGNOSIS — E785 Hyperlipidemia, unspecified: Secondary | ICD-10-CM

## 2021-09-10 DIAGNOSIS — E119 Type 2 diabetes mellitus without complications: Secondary | ICD-10-CM

## 2021-09-10 DIAGNOSIS — M62838 Other muscle spasm: Secondary | ICD-10-CM

## 2021-09-10 MED ORDER — LISINOPRIL-HYDROCHLOROTHIAZIDE 20-12.5 MG PO TABS
1.0000 | ORAL_TABLET | Freq: Every day | ORAL | 3 refills | Status: DC
  Filled 2021-09-10: qty 30, 30d supply, fill #0
  Filled 2021-10-10: qty 30, 30d supply, fill #1
  Filled 2021-11-03: qty 30, 30d supply, fill #2
  Filled 2021-12-02: qty 30, 30d supply, fill #3
  Filled 2022-01-09: qty 30, 30d supply, fill #4

## 2021-09-10 MED ORDER — METHOCARBAMOL 500 MG PO TABS
500.0000 mg | ORAL_TABLET | Freq: Four times a day (QID) | ORAL | 0 refills | Status: DC
  Filled 2021-09-10: qty 90, 23d supply, fill #0

## 2021-09-10 MED ORDER — ATORVASTATIN CALCIUM 20 MG PO TABS
ORAL_TABLET | Freq: Every day | ORAL | 0 refills | Status: DC
  Filled 2021-09-10: qty 90, fill #0
  Filled 2021-09-21: qty 30, 30d supply, fill #0
  Filled 2021-11-03: qty 30, 30d supply, fill #1
  Filled 2021-12-02: qty 30, 30d supply, fill #2

## 2021-09-10 NOTE — Progress Notes (Signed)
Virtual Visit via Telephone Note  I connected with Anthony Schmidt on 09/10/21 at  9:30 AM EST by telephone and verified that I am speaking with the correct person using two identifiers.  Location: Patient: home Provider: my home office   I discussed the limitations, risks, security and privacy concerns of performing an evaluation and management service by telephone and the availability of in person appointments. I also discussed with the patient that there may be a patient responsible charge related to this service. The patient expressed understanding and agreed to proceed.   History of Present Illness:  he has been checking his BP OOO and getting numbers like 160-170/100-110.  Denies HA/CP/SOB.  He is currently on lisinopril 10mg  daily.  He does have a BP cuff. At home.  Blood sugars running 117-180.  Last A1C 6.2 04/2021.    Also c/o R arm pain for about 1 month now.  Bothers him throughout the day.  He is R hand dominant.  Takes tylenol which helps some.      Observations/Objective:  NAD.  A&Ox3   Assessment and Plan: 1. Type 2 diabetes mellitus without complication, with long-term current use of insulin (HCC) Continue metformin and 70/30 as prescribed - Hemoglobin A1c; Future - Lipid panel; Future  2. Dyslipidemia, goal LDL below 70 Needs RF - atorvastatin (LIPITOR) 20 MG tablet; TAKE 1 TABLET (20 MG TOTAL) BY MOUTH DAILY.  Dispense: 90 tablet; Refill: 0 - Comprehensive metabolic panel; Future - Lipid panel; Future  3. Muscle spasm R arm pain-no weakness - methocarbamol (ROBAXIN) 500 MG tablet; Take 1 tablet (500 mg total) by mouth 4 (four) times daily.  Dispense: 90 tablet; Refill: 0 - CBC with Differential/Platelet; Future  4. Uncontrolled hypertension Stop lisinopril 10mg .  Check BP at least once daily and record numbers and bring to next visit in 3 weeks - lisinopril-hydrochlorothiazide (ZESTORETIC) 20-12.5 MG tablet; Take 1 tablet by mouth daily.  Dispense: 90 tablet;  Refill: 3 - CBC with Differential/Platelet; Future - Comprehensive metabolic panel; Future    Follow Up Instructions: 3 week appt with 05/2021 for BP and DM check/labs and 3 months with PCP   I discussed the assessment and treatment plan with the patient. The patient was provided an opportunity to ask questions and all were answered. The patient agreed with the plan and demonstrated an understanding of the instructions.   The patient was advised to call back or seek an in-person evaluation if the symptoms worsen or if the condition fails to improve as anticipated.  I provided 17 minutes of non-face-to-face time during this encounter.   , PA-C  Patient ID: Anthony Schmidt, male   DOB: 02/08/1981, 41 y.o.   MRN: [id number]

## 2021-09-22 ENCOUNTER — Other Ambulatory Visit: Payer: Self-pay

## 2021-09-24 ENCOUNTER — Other Ambulatory Visit: Payer: Self-pay

## 2021-10-10 ENCOUNTER — Other Ambulatory Visit: Payer: Self-pay

## 2021-11-03 ENCOUNTER — Other Ambulatory Visit: Payer: Self-pay | Admitting: Family Medicine

## 2021-11-03 DIAGNOSIS — E1165 Type 2 diabetes mellitus with hyperglycemia: Secondary | ICD-10-CM

## 2021-11-04 ENCOUNTER — Other Ambulatory Visit: Payer: Self-pay

## 2021-11-05 ENCOUNTER — Other Ambulatory Visit: Payer: Self-pay

## 2021-11-05 MED ORDER — METFORMIN HCL ER 500 MG PO TB24
ORAL_TABLET | ORAL | 1 refills | Status: DC
  Filled 2021-11-05: qty 60, 30d supply, fill #0
  Filled 2021-12-02: qty 60, 30d supply, fill #1
  Filled 2022-01-09: qty 60, 30d supply, fill #2

## 2021-11-05 NOTE — Telephone Encounter (Signed)
Requested Prescriptions  ?Pending Prescriptions Disp Refills  ?? metFORMIN (GLUCOPHAGE-XR) 500 MG 24 hr tablet 180 tablet 1  ?  Sig: TAKE 1 TABLET (500 MG TOTAL) BY MOUTH IN THE MORNING AND AT BEDTIME.  ?  ? Endocrinology:  Diabetes - Biguanides Failed - 11/03/2021  9:32 PM  ?  ?  Failed - HBA1C is between 0 and 7.9 and within 180 days  ?  Hemoglobin A1C  ?Date Value Ref Range Status  ?04/30/2021 6.2 (A) 4.0 - 5.6 % Final  ? ?Hgb A1c MFr Bld  ?Date Value Ref Range Status  ?04/07/2020 6.7 (H) 4.8 - 5.6 % Final  ?  Comment:  ?  (NOTE) ?Pre diabetes:          5.7%-6.4% ? ?Diabetes:              >6.4% ? ?Glycemic control for   <7.0% ?adults with diabetes ?  ?   ?  ?  Failed - B12 Level in normal range and within 720 days  ?  No results found for: VITAMINB12   ?  ?  Passed - Cr in normal range and within 360 days  ?  Creatinine, Ser  ?Date Value Ref Range Status  ?04/30/2021 0.99 0.76 - 1.27 mg/dL Final  ?   ?  ?  Passed - eGFR in normal range and within 360 days  ?  GFR calc Af Amer  ?Date Value Ref Range Status  ?04/08/2020 >60 >60 mL/min Final  ? ?GFR calc non Af Amer  ?Date Value Ref Range Status  ?04/08/2020 >60 >60 mL/min Final  ? ?eGFR  ?Date Value Ref Range Status  ?04/30/2021 98 >59 mL/min/1.73 Final  ?   ?  ?  Passed - Valid encounter within last 6 months  ?  Recent Outpatient Visits   ?      ? 1 month ago Type 2 diabetes mellitus without complication, with long-term current use of insulin ([place])  ? [place], [place], [place]  ? 4 months ago Type 2 diabetes mellitus with hyperglycemia, with long-term current use of insulin ([place])  ? [place], [place] W, NP  ? 6 months ago Type 2 diabetes mellitus with hyperglycemia, with long-term current use of insulin ([place])  ? [place], [place], [place]  ? 1 year ago Neuropathy  ? [place], Charlane Ferretti, MD  ? 1 year ago Type 2  diabetes mellitus with hyperglycemia, with long-term current use of insulin ([place])  ? [place], [place] W, NP  ?  ?  ? ?  ?  ?  Passed - CBC within normal limits and completed in the last 12 months  ?  WBC  ?Date Value Ref Range Status  ?04/30/2021 5.1 3.4 - 10.8 x10E3/uL Final  ?04/08/2020 5.6 4.0 - 10.5 K/uL Final  ? ?RBC  ?Date Value Ref Range Status  ?04/30/2021 4.29 4.14 - 5.80 x10E6/uL Final  ?04/08/2020 4.33 4.22 - 5.81 MIL/uL Final  ? ?Hemoglobin  ?Date Value Ref Range Status  ?04/30/2021 12.9 (L) 13.0 - 17.7 g/dL Final  ? ?Hematocrit  ?Date Value Ref Range Status  ?04/30/2021 38.9 37.5 - 51.0 % Final  ? ?MCHC  ?Date Value Ref Range Status  ?04/30/2021 33.2 31.5 - 35.7 g/dL Final  ?04/08/2020 31.9 30.0 - 36.0 g/dL Final  ? ?MCH  ?Date Value Ref  Range Status  ?04/30/2021 30.1 26.6 - 33.0 pg Final  ?04/08/2020 29.3 26.0 - 34.0 pg Final  ? ?MCV  ?Date Value Ref Range Status  ?04/30/2021 91 79 - 97 fL Final  ? ?No results found for: PLTCOUNTKUC, LABPLAT, [place] ?RDW  ?Date Value Ref Range Status  ?04/30/2021 13.2 11.6 - 15.4 % Final  ? ?  ?  ?  ? ? ?

## 2021-11-06 ENCOUNTER — Other Ambulatory Visit: Payer: Self-pay

## 2021-12-03 ENCOUNTER — Other Ambulatory Visit: Payer: Self-pay

## 2022-01-09 ENCOUNTER — Other Ambulatory Visit: Payer: Self-pay

## 2022-01-09 ENCOUNTER — Other Ambulatory Visit: Payer: Self-pay | Admitting: Physician Assistant

## 2022-01-09 DIAGNOSIS — E785 Hyperlipidemia, unspecified: Secondary | ICD-10-CM

## 2022-01-09 DIAGNOSIS — G629 Polyneuropathy, unspecified: Secondary | ICD-10-CM

## 2022-01-09 MED ORDER — GABAPENTIN 300 MG PO CAPS
ORAL_CAPSULE | Freq: Three times a day (TID) | ORAL | 0 refills | Status: DC
  Filled 2022-01-09: qty 30, 30d supply, fill #0

## 2022-01-09 MED ORDER — ATORVASTATIN CALCIUM 20 MG PO TABS
ORAL_TABLET | Freq: Every day | ORAL | 0 refills | Status: DC
  Filled 2022-01-09: qty 30, 30d supply, fill #0

## 2022-01-13 ENCOUNTER — Other Ambulatory Visit: Payer: Self-pay

## 2022-01-14 ENCOUNTER — Other Ambulatory Visit: Payer: Self-pay

## 2022-01-14 ENCOUNTER — Ambulatory Visit: Payer: Self-pay | Attending: Nurse Practitioner | Admitting: Nurse Practitioner

## 2022-01-14 ENCOUNTER — Encounter: Payer: Self-pay | Admitting: Nurse Practitioner

## 2022-01-14 VITALS — BP 164/109 | HR 86 | Ht 75.0 in | Wt 228.8 lb

## 2022-01-14 DIAGNOSIS — E875 Hyperkalemia: Secondary | ICD-10-CM

## 2022-01-14 DIAGNOSIS — D649 Anemia, unspecified: Secondary | ICD-10-CM

## 2022-01-14 DIAGNOSIS — E1140 Type 2 diabetes mellitus with diabetic neuropathy, unspecified: Secondary | ICD-10-CM

## 2022-01-14 DIAGNOSIS — E119 Type 2 diabetes mellitus without complications: Secondary | ICD-10-CM

## 2022-01-14 DIAGNOSIS — Z794 Long term (current) use of insulin: Secondary | ICD-10-CM

## 2022-01-14 DIAGNOSIS — E1165 Type 2 diabetes mellitus with hyperglycemia: Secondary | ICD-10-CM

## 2022-01-14 DIAGNOSIS — E785 Hyperlipidemia, unspecified: Secondary | ICD-10-CM

## 2022-01-14 DIAGNOSIS — I10 Essential (primary) hypertension: Secondary | ICD-10-CM

## 2022-01-14 DIAGNOSIS — M7918 Myalgia, other site: Secondary | ICD-10-CM

## 2022-01-14 LAB — GLUCOSE, POCT (MANUAL RESULT ENTRY): POC Glucose: 119 mg/dl — AB (ref 70–99)

## 2022-01-14 LAB — POCT GLYCOSYLATED HEMOGLOBIN (HGB A1C): HbA1c, POC (controlled diabetic range): 7 % (ref 0.0–7.0)

## 2022-01-14 MED ORDER — INSULIN ISOPHANE & REGULAR (HUMAN 70-30)100 UNIT/ML KWIKPEN
PEN_INJECTOR | SUBCUTANEOUS | 11 refills | Status: DC
  Filled 2022-01-14: qty 15, fill #0
  Filled 2022-02-12: qty 15, 75d supply, fill #0
  Filled 2022-04-30: qty 15, 75d supply, fill #1
  Filled 2022-07-12: qty 15, 75d supply, fill #2
  Filled 2022-10-13: qty 30, 150d supply, fill #3
  Filled 2022-11-01: qty 15, 75d supply, fill #3
  Filled 2022-11-02: qty 30, 150d supply, fill #3

## 2022-01-14 MED ORDER — LOSARTAN POTASSIUM-HCTZ 100-25 MG PO TABS
1.0000 | ORAL_TABLET | Freq: Every day | ORAL | 1 refills | Status: DC
  Filled 2022-01-14: qty 90, 90d supply, fill #0

## 2022-01-14 MED ORDER — MELOXICAM 15 MG PO TABS
15.0000 mg | ORAL_TABLET | Freq: Every day | ORAL | 3 refills | Status: DC
  Filled 2022-01-14: qty 30, 30d supply, fill #0
  Filled 2022-02-08: qty 30, 30d supply, fill #1
  Filled 2022-02-12 – 2022-03-17 (×2): qty 30, 30d supply, fill #2
  Filled 2022-04-17: qty 30, 30d supply, fill #3

## 2022-01-14 MED ORDER — GABAPENTIN 300 MG PO CAPS
600.0000 mg | ORAL_CAPSULE | Freq: Three times a day (TID) | ORAL | 3 refills | Status: DC
  Filled 2022-01-14 (×2): qty 180, 30d supply, fill #0
  Filled 2022-02-08: qty 180, 30d supply, fill #1
  Filled 2022-02-12: qty 180, 30d supply, fill #2

## 2022-01-14 MED ORDER — ATORVASTATIN CALCIUM 20 MG PO TABS
20.0000 mg | ORAL_TABLET | Freq: Every day | ORAL | 1 refills | Status: DC
  Filled 2022-01-14: qty 90, 90d supply, fill #0

## 2022-01-14 MED ORDER — TRUEPLUS 5-BEVEL PEN NEEDLES 31G X 5 MM MISC
6 refills | Status: DC
  Filled 2022-01-14: qty 100, 50d supply, fill #0
  Filled 2022-04-17: qty 100, 50d supply, fill #1
  Filled 2022-07-12: qty 100, 50d supply, fill #2

## 2022-01-14 MED ORDER — METFORMIN HCL ER 500 MG PO TB24
ORAL_TABLET | ORAL | 1 refills | Status: DC
  Filled 2022-02-08: qty 180, 90d supply, fill #0
  Filled 2022-02-12 – 2022-05-12 (×2): qty 180, 90d supply, fill #1

## 2022-01-14 NOTE — Progress Notes (Signed)
Having pain all over body.

## 2022-01-14 NOTE — Progress Notes (Signed)
Assessment & Plan:  Anthony Schmidt was seen today for diabetes, hypertension and hyperlipidemia.  Diagnoses and all orders for this visit:  Type 2 diabetes mellitus with hyperglycemia, with long-term current use of insulin (HCC) -     POCT glucose (manual entry) -     POCT glycosylated hemoglobin (Hb A1C) -     Microalbumin / creatinine urine ratio -     insulin isophane & regular human KwikPen (HUMULIN 70/30 MIX) (70-30) 100 UNIT/ML KwikPen; INJECT 10 UNITS INTO THE SKIN 2 (TWO) TIMES DAILY. -     metFORMIN (GLUCOPHAGE-XR) 500 MG 24 hr tablet; TAKE 1 TABLET (500 MG TOTAL) BY MOUTH IN THE MORNING AND AT BEDTIME. -     Insulin Pen Needle (TRUEPLUS PEN NEEDLES) 31G X 5 MM MISC; USE 2 TIMES A DAY AS DIRECTED  Uncontrolled hypertension -     losartan-hydrochlorothiazide (HYZAAR) 100-25 MG tablet; Take 1 tablet by mouth daily.  Dyslipidemia, goal LDL below 70 -     Lipid panel -     atorvastatin (LIPITOR) 20 MG tablet; Take 1 tablet (20 mg total) by mouth daily.  Type 2 diabetes mellitus with diabetic neuropathy, with long-term current use of insulin (HCC) -     gabapentin (NEURONTIN) 300 MG capsule; Take 2 capsules (600 mg total) by mouth 3 (three) times daily.  Hyperkalemia -     CMP14+EGFR  Anemia, unspecified type -     CBC  Myalgia, multiple sites -     Lupus anticoagulant panel -     Cancel: ANA w/Reflex -     C-reactive protein -     ANA -     meloxicam (MOBIC) 15 MG tablet; Take 1 tablet (15 mg total) by mouth daily.    Patient has been counseled on age-appropriate routine health concerns for screening and prevention. These are reviewed and up-to-date. Referrals have been placed accordingly. Immunizations are up-to-date or declined.    Subjective:   Chief Complaint  Patient presents with   Diabetes   Hypertension   Hyperlipidemia   HPI Anthony Schmidt 41 y.o. male presents to office today for follow up to DM, HTN and HPL  He has a past medical history of DM 2 (01/2020),  Hyperlipidemia, and Hypertension.   He endorses generalized myalgias and pain in his entire body. Worse in right shoulder and arm. 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 Also states pain is present in the bottom of his feet.  Worse with standing or walking for long periods of time. Onset of symptoms several months ago. He denies any injury or trauma. Taking up to 4000 mg of ibuprofen daily. States the gabapentin 300 mg TID helps some for his neuropathy but very minimal relief.     DM Notes average fasting readings 120-140s. He does not monitor postprandial readings. I have recommended he monitor after meal readings  several times a week. He is currently taking glucophage XR and humulin 70/30 ten units BID. LDL at goal with atorvastatin 20 mg daily. Hyperglycemic symptoms include peripheral neuropathy for which he is taking gabapentin.  Lab Results  Component Value Date   HGBA1C 7.0 01/14/2022    Lab Results  Component Value Date   HGBA1C 6.2 (A) 04/30/2021   Lab Results  Component Value Date   LDLCALC 72 04/30/2021      HTN  Blood pressure is poorly controlled. He was switched to hyzaar 100-25 mg daily. Zestorectic was  discontinued.  BP Readings from Last 3 Encounters:  01/14/22 (!) 164/109  04/30/21 (!) 158/104  07/29/20 (!) 147/98     Review of Systems  Constitutional:  Negative for fever, malaise/fatigue and weight loss.  HENT: Negative.  Negative for nosebleeds.   Eyes: Negative.  Negative for blurred vision, double vision and photophobia.  Respiratory: Negative.  Negative for cough and shortness of breath.   Cardiovascular: Negative.  Negative for chest pain, palpitations and leg swelling.  Gastrointestinal: Negative.  Negative for heartburn, nausea and vomiting.  Musculoskeletal:  Positive for joint pain and myalgias.  Neurological: Negative.  Negative for dizziness, focal weakness, seizures and headaches.  Psychiatric/Behavioral:  Negative.  Negative for suicidal ideas.    Past Medical History:  Diagnosis Date   Diabetes mellitus, new onset ([place]) 01/2020   Hyperlipidemia    Hypertension     Past Surgical History:  Procedure Laterality Date   KNEE ARTHROSCOPY Bilateral    KNEE SURGERY Bilateral     Family History  Problem Relation Age of Onset   Diabetes Mother    Hypertension Mother    Diabetes Father    Hypertension Father     Social History Reviewed with no changes to be made today.   Outpatient Medications Prior to Visit  Medication Sig Dispense Refill   acetaminophen (TYLENOL) 500 MG tablet Take 1,000-2,000 mg by mouth daily as needed for headache (pain).     atorvastatin (LIPITOR) 20 MG tablet TAKE 1 TABLET (20 MG TOTAL) BY MOUTH DAILY. 30 tablet 0   blood glucose meter kit and supplies KIT Dispense based on patient and insurance preference. Use up to four times daily as directed. (FOR ICD-9 250.00, 250.01). 1 each 0   Blood Glucose Monitoring Suppl (TRUE METRIX METER) w/Device KIT Use as instructed to check blood sugar TID. 1 kit 0   dextrose (GLUTOSE) 40 % GEL Take 37.5 g by mouth once as needed for up to 1 dose for low blood sugar. 37.5 g 1   glucose blood (TRUE METRIX BLOOD GLUCOSE TEST) test strip Use as instructed to check blood sugar TID. 100 each 6   methocarbamol (ROBAXIN) 500 MG tablet Take 1 tablet (500 mg total) by mouth 4 (four) times daily. 90 tablet 0   TRUEplus Lancets 28G MISC Use as instructed to check blood sugar TID. 100 each 6   gabapentin (NEURONTIN) 300 MG capsule TAKE 1 CAPSULE (300 MG TOTAL) BY MOUTH 3 (THREE) TIMES DAILY. 90 capsule 0   ibuprofen (ADVIL) 600 MG tablet TAKE 1 TABLET (600 MG TOTAL) BY MOUTH EVERY 8 (EIGHT) HOURS AS NEEDED. 60 tablet 0   insulin isophane & regular human KwikPen (HUMULIN 70/30 MIX) (70-30) 100 UNIT/ML KwikPen INJECT 10 UNITS INTO THE SKIN 2 (TWO) TIMES DAILY. 15 mL 11   Insulin Pen Needle (TRUEPLUS PEN NEEDLES) 31G X 5 MM MISC USE 2 TIMES A DAY  AS DIRECTED 100 each 0   Insulin Pen Needle (TRUEPLUS PEN NEEDLES) 31G X 5 MM MISC USE 2 TIMES A DAY AS DIRECTED 100 each 0   lisinopril-hydrochlorothiazide (ZESTORETIC) 20-12.5 MG tablet Take 1 tablet by mouth daily. 90 tablet 3   metFORMIN (GLUCOPHAGE-XR) 500 MG 24 hr tablet TAKE 1 TABLET (500 MG TOTAL) BY MOUTH IN THE MORNING AND AT BEDTIME. 180 tablet 1   No facility-administered medications prior to visit.    No Known Allergies     Objective:    BP (!) 164/109   Pulse 86   Ht 6'  3" (1.905 m)   Wt 228 lb 12.8 oz (103.8 kg)   SpO2 98%   BMI 28.60 kg/m  Wt Readings from Last 3 Encounters:  01/14/22 228 lb 12.8 oz (103.8 kg)  04/30/21 229 lb 6 oz (104 kg)  07/29/20 236 lb (107 kg)    Physical Exam Vitals and nursing note reviewed.  Constitutional:      Appearance: He is well-developed.  HENT:     Head: Normocephalic and atraumatic.  Cardiovascular:     Rate and Rhythm: Normal rate and regular rhythm.     Heart sounds: Normal heart sounds. No murmur heard.   No friction rub. No gallop.  Pulmonary:     Effort: Pulmonary effort is normal. No tachypnea or respiratory distress.     Breath sounds: Normal breath sounds. No decreased breath sounds, wheezing, rhonchi or rales.  Chest:     Chest wall: No tenderness.  Abdominal:     General: Bowel sounds are normal.     Palpations: Abdomen is soft.  Musculoskeletal:        General: Normal range of motion.     Cervical back: Normal range of motion.  Skin:    General: Skin is warm and dry.  Neurological:     Mental Status: He is alert and oriented to person, place, and time.     Coordination: Coordination normal.  Psychiatric:        Behavior: Behavior normal. Behavior is cooperative.        Thought Content: Thought content normal.        Judgment: Judgment normal.         Patient has been counseled extensively about nutrition and exercise as well as the importance of adherence with medications and regular follow-up.  The patient was given clear instructions to go to ER or return to medical center if symptoms don't improve, worsen or new problems develop. The patient verbalized understanding.   Follow-up: Return for virutal visit 2 weeks BP .   Gildardo Pounds, FNP-BC [facility] and [place], [place]   01/14/2022, 10:34 PM

## 2022-01-15 ENCOUNTER — Other Ambulatory Visit: Payer: Self-pay

## 2022-01-15 LAB — ANA: Anti Nuclear Antibody (ANA): NEGATIVE

## 2022-01-15 LAB — MICROALBUMIN / CREATININE URINE RATIO
Creatinine, Urine: 216.8 mg/dL
Microalb/Creat Ratio: 3 mg/g creat (ref 0–29)
Microalbumin, Urine: 6.6 ug/mL

## 2022-01-16 LAB — CMP14+EGFR
ALT: 60 IU/L — ABNORMAL HIGH (ref 0–44)
AST: 58 IU/L — ABNORMAL HIGH (ref 0–40)
Albumin/Globulin Ratio: 1.4 (ref 1.2–2.2)
Albumin: 4.1 g/dL (ref 4.0–5.0)
Alkaline Phosphatase: 78 IU/L (ref 44–121)
BUN/Creatinine Ratio: 12 (ref 9–20)
BUN: 12 mg/dL (ref 6–24)
Bilirubin Total: 0.5 mg/dL (ref 0.0–1.2)
CO2: 19 mmol/L — ABNORMAL LOW (ref 20–29)
Calcium: 9 mg/dL (ref 8.7–10.2)
Chloride: 105 mmol/L (ref 96–106)
Creatinine, Ser: 1.04 mg/dL (ref 0.76–1.27)
Globulin, Total: 3 g/dL (ref 1.5–4.5)
Glucose: 108 mg/dL — ABNORMAL HIGH (ref 70–99)
Potassium: 3.9 mmol/L (ref 3.5–5.2)
Sodium: 139 mmol/L (ref 134–144)
Total Protein: 7.1 g/dL (ref 6.0–8.5)
eGFR: 92 mL/min/{1.73_m2} (ref >59)

## 2022-01-16 LAB — LIPID PANEL
Chol/HDL Ratio: 4.6 ratio (ref 0.0–5.0)
Cholesterol, Total: 155 mg/dL (ref 100–199)
HDL: 34 mg/dL — ABNORMAL LOW (ref >39)
LDL Chol Calc (NIH): 41 mg/dL (ref 0–99)
Triglycerides: 568 mg/dL (ref 0–149)
VLDL Cholesterol Cal: 80 mg/dL — ABNORMAL HIGH (ref 5–40)

## 2022-01-16 LAB — CBC
Hematocrit: 40.9 % (ref 37.5–51.0)
Hemoglobin: 13.8 g/dL (ref 13.0–17.7)
MCH: 30.5 pg (ref 26.6–33.0)
MCHC: 33.7 g/dL (ref 31.5–35.7)
MCV: 90 fL (ref 79–97)
Platelets: 180 10*3/uL (ref 150–450)
RBC: 4.53 x10E6/uL (ref 4.14–5.80)
RDW: 13.8 % (ref 11.6–15.4)
WBC: 5.3 10*3/uL (ref 3.4–10.8)

## 2022-01-16 LAB — LUPUS ANTICOAGULANT PANEL
Dilute Viper Venom Time: 40.2 s (ref 0.0–47.0)
PTT Lupus Anticoagulant: 37.6 s (ref 0.0–43.5)

## 2022-01-16 LAB — C-REACTIVE PROTEIN: CRP: 2 mg/L (ref 0–10)

## 2022-01-21 ENCOUNTER — Other Ambulatory Visit: Payer: Self-pay

## 2022-01-21 ENCOUNTER — Other Ambulatory Visit: Payer: Self-pay | Admitting: Nurse Practitioner

## 2022-01-21 DIAGNOSIS — E785 Hyperlipidemia, unspecified: Secondary | ICD-10-CM

## 2022-01-21 MED ORDER — ATORVASTATIN CALCIUM 80 MG PO TABS
80.0000 mg | ORAL_TABLET | Freq: Every day | ORAL | 1 refills | Status: DC
  Filled 2022-01-21: qty 90, 90d supply, fill #0
  Filled 2022-02-12: qty 90, 90d supply, fill #1

## 2022-01-26 ENCOUNTER — Other Ambulatory Visit: Payer: Self-pay

## 2022-01-27 ENCOUNTER — Encounter: Payer: Self-pay | Admitting: Nurse Practitioner

## 2022-01-27 ENCOUNTER — Ambulatory Visit: Payer: Self-pay | Attending: Nurse Practitioner | Admitting: Nurse Practitioner

## 2022-01-27 ENCOUNTER — Other Ambulatory Visit: Payer: Self-pay

## 2022-01-27 DIAGNOSIS — I10 Essential (primary) hypertension: Secondary | ICD-10-CM

## 2022-01-27 MED ORDER — VALSARTAN 160 MG PO TABS
160.0000 mg | ORAL_TABLET | Freq: Every day | ORAL | 3 refills | Status: DC
  Filled 2022-01-27: qty 90, 90d supply, fill #0
  Filled 2022-02-12 – 2022-03-17 (×2): qty 90, 90d supply, fill #1
  Filled 2022-07-12: qty 30, 30d supply, fill #2
  Filled 2022-08-04: qty 30, 30d supply, fill #3
  Filled 2022-09-27: qty 30, 30d supply, fill #4
  Filled 2022-11-01 – 2022-11-02 (×2): qty 30, 30d supply, fill #5
  Filled 2022-12-14 – 2023-01-07 (×2): qty 30, 30d supply, fill #6

## 2022-01-27 NOTE — Progress Notes (Signed)
Virtual Visit via Telephone Note  I discussed the limitations, risks, security and privacy concerns of performing an evaluation and management service by telephone and the availability of in person appointments. I also discussed with the patient that there may be a patient responsible charge related to this service. The patient expressed understanding and agreed to proceed.    I connected with Anthony Schmidt on 01/27/22  at   9:50 AM EDT  EDT by telephone and verified that I am speaking with the correct person using two identifiers.  Location of Patient: Private Residence   [facility] Provider[facility]    Persons participating in Telemedicine visit: Anthony Schmidt Anthony Schmidt    History of Present Illness: Telemedicine visit for: HTN He has a past medical history of DM 2 (01/2020), Hyperlipidemia, and Hypertension.   At his last office visit with me on 01-14-2022 we switched his zestoretic to hyzaar 100-25 mg daily. Today he notes persistently elevated blood pressure readings 150/90s. At this time we  will switch to valsartan 160mg . He is not a cigarette smoker.  BP Readings from Last 3 Encounters:  01/14/22 (!) 164/109  04/30/21 (!) 158/104  07/29/20 (!) 147/98      Past Medical History:  Diagnosis Date   Diabetes mellitus, new onset (HCC) 01/2020   Hyperlipidemia    Hypertension     Past Surgical History:  Procedure Laterality Date   KNEE ARTHROSCOPY Bilateral    KNEE SURGERY Bilateral     Family History  Problem Relation Age of Onset   Diabetes Mother    Hypertension Mother    Diabetes Father    Hypertension Father     Social History   Socioeconomic History   Marital status: Significant Other    Spouse name: Not on file   Number of children: Not on file   Years of education: Not on file   Highest education level: Not on file  Occupational History   Not on file  Tobacco Use   Smoking status: Never   Smokeless  tobacco: Never  Vaping Use   Vaping Use: Never used  Substance and Sexual Activity   Alcohol use: Yes    Alcohol/week: 6.0 standard drinks of alcohol    Types: 6 Cans of beer per week    Comment: occassionally   Drug use: Never   Sexual activity: Yes  Other Topics Concern   Not on file  Social History Narrative   Not on file   Social Determinants of Health   Financial Resource Strain: Not on file  Food Insecurity: Not on file  Transportation Needs: Not on file  Physical Activity: Not on file  Stress: Not on file  Social Connections: Not on file     Observations/Objective: Awake, alert and oriented x 3   Review of Systems  Constitutional:  Negative for fever, malaise/fatigue and weight loss.  HENT: Negative.  Negative for nosebleeds.   Eyes: Negative.  Negative for blurred vision, double vision and photophobia.  Respiratory: Negative.  Negative for cough and shortness of breath.   Cardiovascular: Negative.  Negative for chest pain, palpitations and leg swelling.  Gastrointestinal: Negative.  Negative for heartburn, nausea and vomiting.  Musculoskeletal: Negative.  Negative for myalgias.  Neurological: Negative.  Negative for dizziness, focal weakness, seizures and headaches.  Psychiatric/Behavioral: Negative.  Negative for suicidal ideas.     Assessment and Plan: Diagnoses and all orders for this visit:  Uncontrolled hypertension -  valsartan (DIOVAN) 160 MG tablet; Take 1 tablet (160 mg total) by mouth once daily. -     Basic metabolic panel; Future Continue all antihypertensives as prescribed.  Remember to bring in your blood pressure log with you for your follow up appointment.  DASH/Mediterranean Diets are healthier choices for HTN.      Follow Up Instructions Return in about 4 weeks (around 02/24/2022).     I discussed the assessment and treatment plan with the patient. The patient was provided an opportunity to ask questions and all were answered. The  patient agreed with the plan and demonstrated an understanding of the instructions.   The patient was advised to call back or seek an in-person evaluation if the symptoms worsen or if the condition fails to improve as anticipated.  I provided 10 minutes of non-face-to-face time during this encounter including median intraservice time, reviewing previous notes, labs, imaging, medications and explaining diagnosis and management.  Claiborne Rigg, Schmidt

## 2022-02-09 ENCOUNTER — Other Ambulatory Visit: Payer: Self-pay

## 2022-02-10 ENCOUNTER — Telehealth: Payer: Self-pay | Admitting: Nurse Practitioner

## 2022-02-10 ENCOUNTER — Other Ambulatory Visit: Payer: Self-pay

## 2022-02-10 NOTE — Telephone Encounter (Signed)
Pt spouse stopped wanting to discuss pt plan of care and medications, pt is having pain with medical condition and HTN. Please contact her [phone].

## 2022-02-12 ENCOUNTER — Other Ambulatory Visit: Payer: Self-pay | Admitting: Physician Assistant

## 2022-02-12 ENCOUNTER — Other Ambulatory Visit: Payer: Self-pay

## 2022-02-12 DIAGNOSIS — M62838 Other muscle spasm: Secondary | ICD-10-CM

## 2022-02-12 MED ORDER — METHOCARBAMOL 500 MG PO TABS
500.0000 mg | ORAL_TABLET | Freq: Four times a day (QID) | ORAL | 0 refills | Status: DC
  Filled 2022-02-12: qty 90, 23d supply, fill #0

## 2022-02-12 NOTE — Telephone Encounter (Signed)
Requested medication (s) are due for refill today - unsure  Requested medication (s) are on the active medication list -yes  Future visit scheduled -yes  Last refill: 09/10/21 #90  Notes to clinic: non delegated Rx  Requested Prescriptions  Pending Prescriptions Disp Refills   methocarbamol (ROBAXIN) 500 MG tablet 90 tablet 0    Sig: Take 1 tablet (500 mg total) by mouth 4 (four) times daily.     Not Delegated - Analgesics:  Muscle Relaxants Failed - 02/12/2022 12:22 PM      Failed - This refill cannot be delegated      Passed - Valid encounter within last 6 months    Recent Outpatient Visits           2 weeks ago Uncontrolled hypertension   [place] [facility] [place], [place] W, NP   4 weeks ago Type 2 diabetes mellitus with hyperglycemia, with long-term current use of insulin [facility])   [place] [facility] [place], [place] W, NP   5 months ago Type 2 diabetes mellitus without complication, with long-term current use of insulin [facility])   [place] [facility] [place], [place], [place]   7 months ago Type 2 diabetes mellitus with hyperglycemia, with long-term current use of insulin [facility])   [place] [facility] [place], [place] W, NP   9 months ago Type 2 diabetes mellitus with hyperglycemia, with long-term current use of insulin [facility])   [facility] [place], [place], [place]       Future Appointments             In 3 weeks Lois Huxley, Cornelius Moras, RPH-CPP [facility]   In 4 weeks [place], [place], [place] [facility]               Requested Prescriptions  Pending Prescriptions Disp Refills   methocarbamol (ROBAXIN) 500 MG tablet 90 tablet 0    Sig: Take 1 tablet (500 mg total) by mouth 4 (four) times daily.     Not Delegated - Analgesics:  Muscle Relaxants Failed - 02/12/2022 12:22 PM      Failed - This  refill cannot be delegated      Passed - Valid encounter within last 6 months    Recent Outpatient Visits           2 weeks ago Uncontrolled hypertension   [place] [facility] [place], [place] W, NP   4 weeks ago Type 2 diabetes mellitus with hyperglycemia, with long-term current use of insulin [facility])   [place] [facility] [place], [place] W, NP   5 months ago Type 2 diabetes mellitus without complication, with long-term current use of insulin [facility])   [place] [place], [place], [place]   7 months ago Type 2 diabetes mellitus with hyperglycemia, with long-term current use of insulin [facility])   [place] [facility] [place], [place] W, NP   9 months ago Type 2 diabetes mellitus with hyperglycemia, with long-term current use of insulin [facility])   [facility] [place], [place], [place]       Future Appointments             In 3 weeks Lois Huxley, Cornelius Moras, RPH-CPP [facility]   In 4 weeks [place], [place]  [facility]

## 2022-02-13 ENCOUNTER — Other Ambulatory Visit: Payer: Self-pay

## 2022-02-18 NOTE — Telephone Encounter (Signed)
Patient states he has generalized soreness and stiffness. Worse in the a.m. and gets better throughout the day.    Taking medication for cholesterol.  He has days that he is in pain but does not know where this pain he indicates above is coming from.  He states he has had testing for Lupus but is unsure why his body is sore and stiff.    He is in the works of completing OC application.

## 2022-02-20 ENCOUNTER — Other Ambulatory Visit: Payer: Self-pay

## 2022-02-20 NOTE — Telephone Encounter (Signed)
He can stop the cholesterol medication for 2 weeks to see if this will help.

## 2022-02-23 ENCOUNTER — Other Ambulatory Visit: Payer: Self-pay

## 2022-02-23 NOTE — Telephone Encounter (Signed)
Left message on voicemail to return call.    Will attempt to call patient back again

## 2022-02-24 ENCOUNTER — Other Ambulatory Visit: Payer: Self-pay

## 2022-02-24 ENCOUNTER — Ambulatory Visit: Payer: Self-pay | Attending: Nurse Practitioner | Admitting: Nurse Practitioner

## 2022-02-24 ENCOUNTER — Encounter: Payer: Self-pay | Admitting: Nurse Practitioner

## 2022-02-24 DIAGNOSIS — E781 Pure hyperglyceridemia: Secondary | ICD-10-CM

## 2022-02-24 DIAGNOSIS — M2550 Pain in unspecified joint: Secondary | ICD-10-CM

## 2022-02-24 MED ORDER — PREGABALIN 75 MG PO CAPS
75.0000 mg | ORAL_CAPSULE | Freq: Two times a day (BID) | ORAL | 0 refills | Status: DC
  Filled 2022-02-24: qty 60, 30d supply, fill #0

## 2022-02-24 NOTE — Progress Notes (Signed)
Virtual Visit via Telephone Note  I discussed the limitations, risks, security and privacy concerns of performing an evaluation and management service by telephone and the availability of in person appointments. I also discussed with the patient that there may be a patient responsible charge related to this service. The patient expressed understanding and agreed to proceed.    I connected with [name] on 02/24/22  at   3:50 PM EDT  EDT by telephone and verified that I am speaking with the correct person using two identifiers.  Location of Patient: Private Residence   [facility] of Provider: [facility]    Persons participating in Telemedicine visit: Bertram Denver FNP-BC [place] Basilio [place]    History of Present Illness: Telemedicine visit for: Joint pain  Notes generalized pain and now with reported swelling in left shoulder, arm and hand. Denies any injury or trauma.  Endorses fatigue and malaise with morning generalized stiffness.      01/14/2022    3:03 PM 04/30/2021    4:28 PM 07/29/2020    2:28 PM  Depression screen PHQ 2/9  Decreased Interest 0 0 0  Down, Depressed, Hopeless 0 0 0  PHQ - 2 Score 0 0 0  Altered sleeping 3 2 0  Tired, decreased energy 3 2 0  Change in appetite 1 2 0  Feeling bad or failure about yourself  0 0 0  Trouble concentrating 0 0 0  Moving slowly or fidgety/restless 0 0 0  Suicidal thoughts 0 0 0  PHQ-9 Score 7 6 0  Difficult doing work/chores  Not difficult at all        01/14/2022    3:04 PM 04/30/2021    4:28 PM 07/29/2020    2:28 PM 02/01/2020    3:15 PM  GAD 7 : Generalized Anxiety Score  Nervous, Anxious, on Edge 0 0 0 0  Control/stop worrying 0 0 0 0  Worry too much - different things 0 0 0 0  Trouble relaxing 3 0 0 0  Restless 3 0 0 0  Easily annoyed or irritable 3 2 0 0  Afraid - awful might happen 0 0 0 0  Total GAD 7 Score 9 2 0 0  Anxiety Difficulty  Not difficult at all       Past Medical  History:  Diagnosis Date   Diabetes mellitus, new onset (HCC) 01/2020   Hyperlipidemia    Hypertension     Past Surgical History:  Procedure Laterality Date   KNEE ARTHROSCOPY Bilateral    KNEE SURGERY Bilateral     Family History  Problem Relation Age of Onset   Diabetes Mother    Hypertension Mother    Diabetes Father    Hypertension Father     Social History   Socioeconomic History   Marital status: Significant Other    Spouse name: Not on file   Number of children: Not on file   Years of education: Not on file   Highest education level: Not on file  Occupational History   Not on file  Tobacco Use   Smoking status: Never   Smokeless tobacco: Never  Vaping Use   Vaping Use: Never used  Substance and Sexual Activity   Alcohol use: Yes    Alcohol/week: 6.0 standard drinks of alcohol    Types: 6 Cans of beer per week    Comment: occassionally   Drug use: Never   Sexual activity: Yes  Other Topics Concern   Not  on file  Social History Narrative   Not on file   Social Determinants of Health   Financial Resource Strain: Not on file  Food Insecurity: Not on file  Transportation Needs: Not on file  Physical Activity: Not on file  Stress: Not on file  Social Connections: Not on file     Observations/Objective: Awake, alert and oriented x 3   Review of Systems  Constitutional:  Positive for malaise/fatigue. Negative for fever and weight loss.  HENT: Negative.  Negative for nosebleeds.   Eyes: Negative.  Negative for blurred vision, double vision and photophobia.  Respiratory: Negative.  Negative for cough and shortness of breath.   Cardiovascular: Negative.  Negative for chest pain, palpitations and leg swelling.  Gastrointestinal: Negative.  Negative for heartburn, nausea and vomiting.  Musculoskeletal:  Positive for joint pain and myalgias.  Neurological: Negative.  Negative for dizziness, focal weakness, seizures and headaches.  Psychiatric/Behavioral:   Negative for suicidal ideas. The patient has insomnia.     Assessment and Plan: Diagnoses and all orders for this visit:  Arthralgia of multiple joints -     Rheumatoid Arthritis Profile; Future -     Uric Acid; Future -     Sedimentation Rate; Future -     pregabalin (LYRICA) 75 MG capsule; Take 1 capsule (75 mg total) by mouth 2 (two) times daily. STOP GABAPENTIN  Hypertriglyceridemia -     Lipase; Future -     Amylase; Future     Follow Up Instructions Return if symptoms worsen or fail to improve.     I discussed the assessment and treatment plan with the patient. The patient was provided an opportunity to ask questions and all were answered. The patient agreed with the plan and demonstrated an understanding of the instructions.   The patient was advised to call back or seek an in-person evaluation if the symptoms worsen or if the condition fails to improve as anticipated.  I provided 14 minutes of non-face-to-face time during this encounter including median intraservice time, reviewing previous notes, labs, imaging, medications and explaining diagnosis and management.  Claiborne Rigg, FNP-BC

## 2022-02-25 ENCOUNTER — Other Ambulatory Visit: Payer: Self-pay

## 2022-02-26 ENCOUNTER — Ambulatory Visit: Payer: 59 | Attending: Nurse Practitioner

## 2022-02-26 ENCOUNTER — Other Ambulatory Visit: Payer: Self-pay

## 2022-02-26 DIAGNOSIS — I10 Essential (primary) hypertension: Secondary | ICD-10-CM

## 2022-02-26 DIAGNOSIS — M2550 Pain in unspecified joint: Secondary | ICD-10-CM

## 2022-02-26 DIAGNOSIS — E781 Pure hyperglyceridemia: Secondary | ICD-10-CM

## 2022-02-27 NOTE — Telephone Encounter (Signed)
Patient aware of message. Had virtual apt with PCP on 02/24/2022.

## 2022-02-28 LAB — BASIC METABOLIC PANEL
BUN/Creatinine Ratio: 14 (ref 9–20)
BUN: 15 mg/dL (ref 6–24)
CO2: 14 mmol/L — ABNORMAL LOW (ref 20–29)
Calcium: 9.5 mg/dL (ref 8.7–10.2)
Chloride: 102 mmol/L (ref 96–106)
Creatinine, Ser: 1.06 mg/dL (ref 0.76–1.27)
Glucose: 157 mg/dL — ABNORMAL HIGH (ref 70–99)
Potassium: 4.1 mmol/L (ref 3.5–5.2)
Sodium: 134 mmol/L (ref 134–144)
eGFR: 90 mL/min/{1.73_m2} (ref >59)

## 2022-02-28 LAB — AMYLASE: Amylase: 115 U/L — ABNORMAL HIGH (ref 31–110)

## 2022-02-28 LAB — RHEUMATOID ARTHRITIS PROFILE
Cyclic Citrullin Peptide Ab: 6 units (ref 0–19)
Rheumatoid fact SerPl-aCnc: 10.1 IU/mL (ref <14.0)

## 2022-02-28 LAB — SEDIMENTATION RATE: Sed Rate: 29 mm/hr — ABNORMAL HIGH (ref 0–15)

## 2022-02-28 LAB — LIPASE: Lipase: 57 U/L (ref 13–78)

## 2022-02-28 LAB — URIC ACID: Uric Acid: 8.1 mg/dL (ref 3.8–8.4)

## 2022-03-03 ENCOUNTER — Other Ambulatory Visit: Payer: Self-pay | Admitting: Nurse Practitioner

## 2022-03-03 DIAGNOSIS — K852 Alcohol induced acute pancreatitis without necrosis or infection: Secondary | ICD-10-CM

## 2022-03-03 DIAGNOSIS — E782 Mixed hyperlipidemia: Secondary | ICD-10-CM

## 2022-03-04 ENCOUNTER — Telehealth: Payer: Self-pay | Admitting: *Deleted

## 2022-03-04 NOTE — Telephone Encounter (Signed)
Left message on voicemail to return call.  Scheduled appt with radiology for CT scan.  Will need to p/u prep from hospital. Apt is 03/11/2022 at 10:45

## 2022-03-05 ENCOUNTER — Other Ambulatory Visit: Payer: Self-pay

## 2022-03-05 ENCOUNTER — Ambulatory Visit: Payer: Self-pay | Attending: Nurse Practitioner | Admitting: Pharmacist

## 2022-03-05 VITALS — BP 141/95

## 2022-03-05 DIAGNOSIS — I10 Essential (primary) hypertension: Secondary | ICD-10-CM

## 2022-03-05 MED ORDER — AMLODIPINE BESYLATE 5 MG PO TABS
5.0000 mg | ORAL_TABLET | Freq: Every day | ORAL | 2 refills | Status: DC
  Filled 2022-03-05: qty 30, 30d supply, fill #0
  Filled 2022-03-17: qty 30, 30d supply, fill #1
  Filled 2022-04-30: qty 30, 30d supply, fill #2

## 2022-03-05 NOTE — Progress Notes (Signed)
S:    Anthony Schmidt is a 41 y.o. male who presents for hypertension evaluation, education, and management. PMH is significant for HTN, HLD, T2DM, hx of hyperkalemia. Patient was referred and last seen by Primary Care Provider, Bertram Denver, on 01/14/22. At that visit, BP was 164/109 and patient was switched from lisinopril/HCTZ to losartan/HCTZ. On 01/27/22 losartan/HCTZ was changed to valsartan 160mg  after a virtual visit where patient reported presistently elevated home BP readings in 150's/90's.    Today, patient arrives in good spirits and presents without assistance. Reports persistent dizziness/lightheadedness, severe headache, and blurred vision. He states that these symptoms began when he was first diagnosed with HTN and began treatment over a year ago. He says he only gets headaches if he takes BP meds during day, and has switched to taking medication before bed as a result. He reports having more difficulty sleeping now but says that side effects are improved. During discussion of therapeutic options, patient is enthusiastic about starting amlodipine and states he wants to try whatever would give him the strongest BP lowering effect.   Patient reports hypertension was diagnosed in 2022.   Family/Social history: nonsmoker, self-pay; family hx of DM and HTN  Medication adherence good. Patient has not taken BP medication today, but did take it last night before bed. He reports never missing any doses.   Current antihypertensives include: valsartan 160mg  daily   Antihypertensives tried in the past include: lisinopril, lisinopril/HCTZ, losartan/HCTZ  Reported home BP readings: He reports 182/140 a few nights ago, which is the highest he has seen. His lowest home SBP has been 139. He reports checking BP 4x a day (morning, before lunch, before working out around 3pm, before bed). He states his average SBP is >150.   Patient reported dietary habits: Patient says he tries to watch what he eats.  He reports that he "doesn't do salt" and never eats out. He says he does eat sliced lunch meat. He says his wife cooks with a salt substitute but he is not sure of the name. He ate salad and salmon last night; reports that he did add soy sauce to the salmon.  He does not drink coffee or soda, but does drink 1-2 beers per night.   Patient-reported exercise habits: Patient reports exercising 4-5 days per week and staying at gym for at least 2 hours at a time with his 5 boys. He also stays active chasing around his 5-year-old daily.   ASCVD risk factors include: HTN, T2DM, HLD  O:  Vitals:   03/05/22 0915  BP: (!) 141/95   Last 3 Office BP readings: BP Readings from Last 3 Encounters:  03/05/22 (!) 141/95  01/14/22 (!) 164/109  04/30/21 (!) 158/104   BMET    Component Value Date/Time   NA 134 02/26/2022 0841   K 4.1 02/26/2022 0841   CL 102 02/26/2022 0841   CO2 14 (L) 02/26/2022 0841   GLUCOSE 157 (H) 02/26/2022 0841   GLUCOSE 122 (H) 04/08/2020 0726   BUN 15 02/26/2022 0841   CREATININE 1.06 02/26/2022 0841   CALCIUM 9.5 02/26/2022 0841   GFRNONAA >60 04/08/2020 0726   GFRAA >60 04/08/2020 0726    Renal function: CrCl cannot be calculated (Unknown ideal weight.).  Clinical ASCVD: No  The 10-year ASCVD risk score (Arnett DK, et al., 2019) is: 13.3%   Values used to calculate the score:     Age: 41 years     Sex: Male     Is  Non-Hispanic African American: Yes     Diabetic: Yes     Tobacco smoker: No     Systolic Blood Pressure: 141 mmHg     Is BP treated: Yes     HDL Cholesterol: 34 mg/dL     Total Cholesterol: 155 mg/dL   A/P: Hypertension diagnosed 2022 uncontrolled on current medications. BP goal < 130/80 mmHg. Medication adherence appears good.  -Continued valsartan 160mg  daily. -Initiated amlodipine 5mg  daily.  -Patient educated on purpose, proper use, and potential adverse effects of amlodipine. Explained to patient that he is starting a low dose and that we  would like to see him again in 1 month to titrate to optimal dose if needed.   -Commended patient on his success in implementing healthy lifestyle practices. Encouraged patient to continue reduced dietary sodium intake and exercise. -Encouraged patient to continue checking BP at home and asked him to bring log of readings to next visit. -Talked to patient about relative hypotension and explained that he may experience headaches and dizziness while his body adjusts to lowering BP.  -Consider checking potassium and kidney function at next visit.   Results reviewed and written information provided.    Written patient instructions provided. Patient verbalized understanding of treatment plan.  Total time in face to face counseling 30 minutes.    Follow-up:  Pharmacist 04/07/22. Clinic visit with 03/12/22.   Patient seen with  Georgian Co, PharmD Candidate [employer]  Coralyn Helling, PharmD, [place], CPP Clinical Pharmacist [facility] [phone]

## 2022-03-06 ENCOUNTER — Encounter: Payer: Self-pay | Admitting: Nurse Practitioner

## 2022-03-08 ENCOUNTER — Encounter: Payer: Self-pay | Admitting: Nurse Practitioner

## 2022-03-11 ENCOUNTER — Ambulatory Visit (HOSPITAL_COMMUNITY)
Admission: RE | Admit: 2022-03-11 | Discharge: 2022-03-11 | Disposition: A | Discharge status: Home / Self Care | Payer: Self-pay | Source: Ambulatory Visit | Attending: Nurse Practitioner | Admitting: Nurse Practitioner

## 2022-03-11 ENCOUNTER — Other Ambulatory Visit: Payer: Self-pay

## 2022-03-11 DIAGNOSIS — E782 Mixed hyperlipidemia: Secondary | ICD-10-CM | POA: Insufficient documentation

## 2022-03-11 DIAGNOSIS — K852 Alcohol induced acute pancreatitis without necrosis or infection: Secondary | ICD-10-CM | POA: Insufficient documentation

## 2022-03-11 MED ORDER — IOHEXOL 300 MG/ML  SOLN
100.0000 mL | Freq: Once | INTRAMUSCULAR | Status: AC | PRN
  Administered 2022-03-11: 100 mL via INTRAVENOUS

## 2022-03-12 ENCOUNTER — Ambulatory Visit: Payer: Self-pay | Admitting: Physician Assistant

## 2022-03-13 ENCOUNTER — Ambulatory Visit: Payer: Self-pay

## 2022-03-13 NOTE — Telephone Encounter (Signed)
  Chief Complaint: medication request Symptoms: pain in elbows and feet related to lupus Frequency: ongoing issue Pertinent Negatives: NA Disposition: [] ED /[] Urgent Care (no appt availability in office) / [x] Appointment(In office/virtual)/ []  [facility]/ [] Home Care/ [] Refused Recommended Disposition /[] [facility]/ []  Follow-up with PCP Additional Notes: spoke with Ms. , pt's friend and she is asking about why pt isn't on hydroxychloroquine or prednisone to help with pain. The medications pt is on she and pt feel like isn't controlling pain. She had been talking with her friend who has lupus and was on these medications so concerned why pt isn't on them and feels like may be related to not having insurance. I advised her I felt like it would be best if pt speak with provider about these concerns and scheduled appt for 03/17/22 at 1550.   Summary: lupus and pain medication discussion   called to speak with provider about lupus medications / she has questions about pain meds that is not working / please call the spouse      mycophenolate Reason for Disposition  Prescription request for new medicine (not a refill)  Answer Assessment - Initial Assessment Questions 1. NAME of MEDICINE: "What medicine(s) are you calling about?"     Hydroxychloroquine, prednisone  2. QUESTION: "What is your question?" (e.g., double dose of medicine, side effect)     Wanting to see if he could take these medication to help with pain control  3. PRESCRIBER: "Who prescribed the medicine?" Reason: if prescribed by specialist, call should be referred to that group.     NA 4. SYMPTOMS: "Do you have any symptoms?" If Yes, ask: "What symptoms are you having?"  "How bad are the symptoms (e.g., mild, moderate, severe)     Having a lot of pain r/t to lupus, elbows, both feet  Protocols used: Medication Question Call-A-AH

## 2022-03-17 ENCOUNTER — Other Ambulatory Visit: Payer: Self-pay | Admitting: Nurse Practitioner

## 2022-03-17 ENCOUNTER — Telehealth (HOSPITAL_BASED_OUTPATIENT_CLINIC_OR_DEPARTMENT_OTHER): Payer: Self-pay | Admitting: Nurse Practitioner

## 2022-03-17 ENCOUNTER — Encounter: Payer: Self-pay | Admitting: Nurse Practitioner

## 2022-03-17 ENCOUNTER — Other Ambulatory Visit: Payer: Self-pay | Admitting: Family Medicine

## 2022-03-17 DIAGNOSIS — Z794 Long term (current) use of insulin: Secondary | ICD-10-CM

## 2022-03-17 DIAGNOSIS — M62838 Other muscle spasm: Secondary | ICD-10-CM

## 2022-03-17 DIAGNOSIS — E119 Type 2 diabetes mellitus without complications: Secondary | ICD-10-CM

## 2022-03-17 DIAGNOSIS — M2550 Pain in unspecified joint: Secondary | ICD-10-CM

## 2022-03-17 NOTE — Progress Notes (Signed)
Virtual Visit via Telephone Note  I discussed the limitations, risks, security and privacy concerns of performing an evaluation and management service by telephone and the availability of in person appointments. I also discussed with the patient that there may be a patient responsible charge related to this service. The patient expressed understanding and agreed to proceed.    I connected with Anthony Schmidt on 03/17/22  at   3:50 PM EDT  EDT by telephone and verified that I am speaking with the correct person using two identifiers.  Location of Patient: Private Residence   [facility] Provider[facility]    Persons participating in Telemedicine visit: Bertram Denver FNP-BC Kadyn Basilio [place]    History of Present Illness: Telemedicine visit for: Joint Pain  Anthony Schmidt continues to endorse multiple joint arthralgias despite taking lyrica and meloxicam. RA panel normal, Lupus anticoagulant panel negative, Uric acid normal. Associated symptoms include fatigue, myalgia, swelling in bilateral elbows and knees per patient report. I had him stop the atorvastatin 80mg  to ensure pain was not related to statin myalgias however pain has persisted despite not taking cholesterol medication for several weeks.     Past Medical History:  Diagnosis Date   Diabetes mellitus, new onset (HCC) 01/2020   Hyperlipidemia    Hypertension     Past Surgical History:  Procedure Laterality Date   KNEE ARTHROSCOPY Bilateral    KNEE SURGERY Bilateral     Family History  Problem Relation Age of Onset   Diabetes Mother    Hypertension Mother    Diabetes Father    Hypertension Father     Social History   Socioeconomic History   Marital status: Significant Other    Spouse name: Not on file   Number of children: Not on file   Years of education: Not on file   Highest education level: Not on file  Occupational History   Not on file  Tobacco Use   Smoking status: Never    Smokeless tobacco: Never  Vaping Use   Vaping Use: Never used  Substance and Sexual Activity   Alcohol use: Yes    Alcohol/week: 6.0 standard drinks of alcohol    Types: 6 Cans of beer per week    Comment: occassionally   Drug use: Never   Sexual activity: Yes  Other Topics Concern   Not on file  Social History Narrative   Not on file   Social Determinants of Health   Financial Resource Strain: Not on file  Food Insecurity: Not on file  Transportation Needs: Not on file  Physical Activity: Not on file  Stress: Not on file  Social Connections: Not on file     Observations/Objective: Awake, alert and oriented x 3   Review of Systems  Constitutional:  Positive for malaise/fatigue. Negative for fever and weight loss.  HENT: Negative.  Negative for nosebleeds.   Eyes: Negative.  Negative for blurred vision, double vision and photophobia.  Respiratory: Negative.  Negative for cough and shortness of breath.   Cardiovascular: Negative.  Negative for chest pain, palpitations and leg swelling.  Gastrointestinal: Negative.  Negative for heartburn, nausea and vomiting.  Musculoskeletal:  Positive for joint pain and myalgias.  Neurological: Negative.  Negative for dizziness, focal weakness, seizures and headaches.  Psychiatric/Behavioral: Negative.  Negative for suicidal ideas.     Assessment and Plan: Diagnoses and all orders for this visit:  Arthralgia of multiple joints -     Ambulatory referral to Physical Medicine  Rehab May resume statin at this time    Follow Up Instructions Return if symptoms worsen or fail to improve.     I discussed the assessment and treatment plan with the patient. The patient was provided an opportunity to ask questions and all were answered. The patient agreed with the plan and demonstrated an understanding of the instructions.   The patient was advised to call back or seek an in-person evaluation if the symptoms worsen or if the condition fails  to improve as anticipated.  I provided 12 minutes of non-face-to-face time during this encounter including median intraservice time, reviewing previous notes, labs, imaging, medications and explaining diagnosis and management.  Claiborne Rigg, FNP-BC

## 2022-03-18 ENCOUNTER — Other Ambulatory Visit: Payer: Self-pay

## 2022-03-18 MED ORDER — TRUE METRIX BLOOD GLUCOSE TEST VI STRP
ORAL_STRIP | 2 refills | Status: AC
  Filled 2022-03-18: qty 100, 33d supply, fill #0
  Filled 2022-04-17: qty 100, 33d supply, fill #1
  Filled 2022-07-12: qty 100, 33d supply, fill #2

## 2022-03-18 MED ORDER — PREGABALIN 75 MG PO CAPS
75.0000 mg | ORAL_CAPSULE | Freq: Two times a day (BID) | ORAL | 0 refills | Status: DC
  Filled 2022-03-18 – 2022-04-17 (×2): qty 60, 30d supply, fill #0

## 2022-03-18 MED ORDER — METHOCARBAMOL 500 MG PO TABS
500.0000 mg | ORAL_TABLET | Freq: Four times a day (QID) | ORAL | 0 refills | Status: DC
  Filled 2022-03-18: qty 90, 23d supply, fill #0

## 2022-04-07 ENCOUNTER — Ambulatory Visit: Payer: Self-pay | Admitting: Pharmacist

## 2022-04-17 ENCOUNTER — Other Ambulatory Visit: Payer: Self-pay

## 2022-04-21 ENCOUNTER — Other Ambulatory Visit: Payer: Self-pay

## 2022-04-22 ENCOUNTER — Other Ambulatory Visit: Payer: Self-pay

## 2022-04-30 ENCOUNTER — Other Ambulatory Visit: Payer: Self-pay

## 2022-05-01 ENCOUNTER — Other Ambulatory Visit: Payer: Self-pay

## 2022-05-05 ENCOUNTER — Other Ambulatory Visit: Payer: Self-pay

## 2022-05-05 ENCOUNTER — Ambulatory Visit: Payer: Self-pay | Attending: Nurse Practitioner | Admitting: Pharmacist

## 2022-05-05 ENCOUNTER — Encounter: Payer: Self-pay | Admitting: Pharmacist

## 2022-05-05 VITALS — BP 134/79 | HR 83

## 2022-05-05 DIAGNOSIS — I10 Essential (primary) hypertension: Secondary | ICD-10-CM

## 2022-05-05 DIAGNOSIS — M62838 Other muscle spasm: Secondary | ICD-10-CM

## 2022-05-05 MED ORDER — METHOCARBAMOL 500 MG PO TABS
500.0000 mg | ORAL_TABLET | Freq: Four times a day (QID) | ORAL | 0 refills | Status: DC
  Filled 2022-05-05: qty 90, 23d supply, fill #0

## 2022-05-05 NOTE — Progress Notes (Signed)
   S:    Anthony Schmidt is a 41 y.o. male who presents for hypertension evaluation, education, and management. PMH is significant for HTN, HLD, T2DM, hx of hyperkalemia. Patient was referred and last seen by Primary Care Provider, Anthony Schmidt, on 03/17/2022. Pharmacy saw him 03/05/2022 and added amlodipine.   Today, patient arrives in good spirits and presents without assistance. Is doing well since his last visit. He is working out now 5 days a week. He is compliant with his antihypertensives. Denies any HA, dizziness, chest pain, or dyspnea.    Patient reports hypertension was diagnosed in 2022.   Family/Social history:  -Fhx:DM and HTN -Tobacco: nonsmoker  Insurance/Medication coverage: none  Medication adherence reported. Patient has taken BP medication today.  Current antihypertensives include: amlodipine 5mg  daily, valsartan 160mg  daily   Antihypertensives tried in the past include: lisinopril, lisinopril/HCTZ, losartan/HCTZ  Reported home BP readings: unable to recall any readings since his last visit with me.   Patient reported dietary habits:  - Tries to watch what he eats.  - Compliant with sodium restriction, does not drink caffeine   Patient-reported exercise habits: Patient reports exercising 5 days per week and staying at gym for at least 2 hours at a time with his 5 boys.   ASCVD risk factors include: HTN, T2DM, HLD  O:  Vitals:   05/05/22 1421  BP: 134/79  Pulse: 83   Last 3 Office BP readings: BP Readings from Last 3 Encounters:  05/05/22 134/79  03/05/22 (!) 141/95  01/14/22 (!) 164/109   BMET    Component Value Date/Time   NA 134 02/26/2022 0841   K 4.1 02/26/2022 0841   CL 102 02/26/2022 0841   CO2 14 (L) 02/26/2022 0841   GLUCOSE 157 (H) 02/26/2022 0841   GLUCOSE 122 (H) 04/08/2020 0726   BUN 15 02/26/2022 0841   CREATININE 1.06 02/26/2022 0841   CALCIUM 9.5 02/26/2022 0841   GFRNONAA >60 04/08/2020 0726   GFRAA >60 04/08/2020 0726    Renal  function: CrCl cannot be calculated (Patient's most recent lab result is older than the maximum 21 days allowed.).  Clinical ASCVD: No  The 10-year ASCVD risk score (Arnett DK, et al., 2019) is: 12.1%   Values used to calculate the score:     Age: 28 years     Sex: Male     Is Non-Hispanic African American: Yes     Diabetic: Yes     Tobacco smoker: No     Systolic Blood Pressure: 948 mmHg     Is BP treated: Yes     HDL Cholesterol: 34 mg/dL     Total Cholesterol: 155 mg/dL   A/P: Hypertension: at goal on current medications. BP goal < 130/80 mmHg. Medication adherence is optimal. No changes today.  -Continued valsartan 160mg  daily. -Continued amlodipine 5mg  daily.  -Patient educated on purpose, proper use, and potential adverse effects of amlodipine, valsartan. -Commended patient on his success in implementing healthy lifestyle practices. Encouraged patient to continue reduced dietary sodium intake and exercise. -Encouraged patient to continue checking BP at home and asked him to bring log of readings to next visit.  Results reviewed and written information provided.    Written patient instructions provided. Patient verbalized understanding of treatment plan.  Total time in face to face counseling 30 minutes.    Follow-up:  W/ me: prn W/ PCP: 2-3 months.  Benard Halsted, PharmD, [place], [place] [phone]

## 2022-05-12 ENCOUNTER — Other Ambulatory Visit: Payer: Self-pay

## 2022-05-13 ENCOUNTER — Other Ambulatory Visit: Payer: Self-pay

## 2022-05-15 ENCOUNTER — Other Ambulatory Visit: Payer: Self-pay

## 2022-05-21 ENCOUNTER — Other Ambulatory Visit: Payer: Self-pay

## 2022-05-21 ENCOUNTER — Other Ambulatory Visit: Payer: Self-pay | Admitting: Nurse Practitioner

## 2022-05-21 ENCOUNTER — Other Ambulatory Visit: Payer: Self-pay | Admitting: Family Medicine

## 2022-05-21 DIAGNOSIS — M2550 Pain in unspecified joint: Secondary | ICD-10-CM

## 2022-05-21 DIAGNOSIS — M7918 Myalgia, other site: Secondary | ICD-10-CM

## 2022-05-21 DIAGNOSIS — M62838 Other muscle spasm: Secondary | ICD-10-CM

## 2022-05-21 MED ORDER — MELOXICAM 15 MG PO TABS
15.0000 mg | ORAL_TABLET | Freq: Every day | ORAL | 0 refills | Status: DC
  Filled 2022-05-21 – 2022-05-28 (×2): qty 90, 90d supply, fill #0

## 2022-05-21 MED ORDER — METHOCARBAMOL 500 MG PO TABS
500.0000 mg | ORAL_TABLET | Freq: Four times a day (QID) | ORAL | 0 refills | Status: DC
  Filled 2022-05-21 – 2022-05-28 (×2): qty 90, 23d supply, fill #0

## 2022-05-21 MED ORDER — AMLODIPINE BESYLATE 5 MG PO TABS
5.0000 mg | ORAL_TABLET | Freq: Every day | ORAL | 2 refills | Status: DC
  Filled 2022-05-21 – 2022-05-28 (×2): qty 30, 30d supply, fill #0
  Filled 2022-06-29 (×2): qty 30, 30d supply, fill #1
  Filled 2022-08-04: qty 30, 30d supply, fill #2

## 2022-05-21 NOTE — Telephone Encounter (Signed)
Requested Prescriptions  Pending Prescriptions Disp Refills  . meloxicam (MOBIC) 15 MG tablet 90 tablet 0    Sig: Take 1 tablet (15 mg total) by mouth daily.     Analgesics:  COX2 Inhibitors Failed - 05/21/2022  2:26 AM      Failed - Manual Review: Labs are only required if the patient has taken medication for more than 8 weeks.      Failed - AST in normal range and within 360 days    AST  Date Value Ref Range Status  01/14/2022 58 (H) 0 - 40 IU/L Final         Failed - ALT in normal range and within 360 days    ALT  Date Value Ref Range Status  01/14/2022 60 (H) 0 - 44 IU/L Final         Passed - HGB in normal range and within 360 days    Hemoglobin  Date Value Ref Range Status  01/14/2022 13.8 13.0 - 17.7 g/dL Final         Passed - Cr in normal range and within 360 days    Creatinine, Ser  Date Value Ref Range Status  02/26/2022 1.06 0.76 - 1.27 mg/dL Final         Passed - HCT in normal range and within 360 days    Hematocrit  Date Value Ref Range Status  01/14/2022 40.9 37.5 - 51.0 % Final         Passed - eGFR is 30 or above and within 360 days    GFR calc Af Amer  Date Value Ref Range Status  04/08/2020 >60 >60 mL/min Final   GFR calc non Af Amer  Date Value Ref Range Status  04/08/2020 >60 >60 mL/min Final   eGFR  Date Value Ref Range Status  02/26/2022 90 >59 mL/min/1.73 Final         Passed - Patient is not pregnant      Passed - Valid encounter within last 12 months    Recent Outpatient Visits          2 weeks ago Muscle spasm   [place], Jarome Matin, RPH-CPP   2 months ago Arthralgia of multiple joints   [place], Vernia Buff, NP   2 months ago Uncontrolled hypertension   [place], Jarome Matin, RPH-CPP   2 months ago Arthralgia of multiple joints   [place], Vernia Buff, NP   3  months ago Uncontrolled hypertension   [place], Vernia Buff, NP      Future Appointments            In 2 months Gildardo Pounds, NP [place]           . pregabalin (LYRICA) 75 MG capsule 60 capsule 0    Sig: Take 1 capsule (75 mg total) by mouth 2 (two) times daily. STOP GABAPENTIN     Not Delegated - Neurology:  Anticonvulsants - Controlled - pregabalin Failed - 05/21/2022  2:26 AM      Failed - This refill cannot be delegated      Passed - Cr in normal range and within 360 days    Creatinine, Ser  Date Value Ref Range Status  02/26/2022 1.06 0.76 - 1.27 mg/dL Final  Passed - Completed PHQ-2 or PHQ-9 in the last 360 days      Passed - Valid encounter within last 12 months    Recent Outpatient Visits          2 weeks ago Muscle spasm   [place], RPH-CPP   2 months ago Arthralgia of multiple joints   [place], Vernia Buff, NP   2 months ago Uncontrolled hypertension   [place], RPH-CPP   2 months ago Arthralgia of multiple joints   [place], Vernia Buff, NP   3 months ago Uncontrolled hypertension   [place], Vernia Buff, NP      Future Appointments            In 2 months Gildardo Pounds, NP [place]

## 2022-05-21 NOTE — Telephone Encounter (Signed)
Requested medication (s) are due for refill today:yes  Requested medication (s) are on the active medication list: yes  Last refill:  03/27/22  Future visit scheduled: yes  Notes to clinic:  Unable to refill per protocol, cannot delegate Lyrica     Requested Prescriptions  Pending Prescriptions Disp Refills   pregabalin (LYRICA) 75 MG capsule 60 capsule 0    Sig: Take 1 capsule (75 mg total) by mouth 2 (two) times daily. STOP GABAPENTIN     Not Delegated - Neurology:  Anticonvulsants - Controlled - pregabalin Failed - 05/21/2022  2:26 AM      Failed - This refill cannot be delegated      Passed - Cr in normal range and within 360 days    Creatinine, Ser  Date Value Ref Range Status  02/26/2022 1.06 0.76 - 1.27 mg/dL Final         Passed - Completed PHQ-2 or PHQ-9 in the last 360 days      Passed - Valid encounter within last 12 months    Recent Outpatient Visits           2 weeks ago Muscle spasm   [place], Jarome Matin, RPH-CPP   2 months ago Arthralgia of multiple joints   [place], Vernia Buff, NP   2 months ago Uncontrolled hypertension   [place], Jarome Matin, RPH-CPP   2 months ago Arthralgia of multiple joints   [place], Vernia Buff, NP   3 months ago Uncontrolled hypertension   [place], Vernia Buff, NP       Future Appointments             In 2 months Gildardo Pounds, NP [place]            Signed Prescriptions Disp Refills   meloxicam (MOBIC) 15 MG tablet 90 tablet 0    Sig: Take 1 tablet (15 mg total) by mouth daily.     Analgesics:  COX2 Inhibitors Failed - 05/21/2022  2:26 AM      Failed - Manual Review: Labs are only required if the patient has taken medication for more than 8 weeks.      Failed - AST in normal  range and within 360 days    AST  Date Value Ref Range Status  01/14/2022 58 (H) 0 - 40 IU/L Final         Failed - ALT in normal range and within 360 days    ALT  Date Value Ref Range Status  01/14/2022 60 (H) 0 - 44 IU/L Final         Passed - HGB in normal range and within 360 days    Hemoglobin  Date Value Ref Range Status  01/14/2022 13.8 13.0 - 17.7 g/dL Final         Passed - Cr in normal range and within 360 days    Creatinine, Ser  Date Value Ref Range Status  02/26/2022 1.06 0.76 - 1.27 mg/dL Final         Passed - HCT in normal range and within 360 days    Hematocrit  Date Value Ref Range Status  01/14/2022 40.9 37.5 - 51.0 % Final         Passed - eGFR is 30 or above and  within 360 days    GFR calc Af Amer  Date Value Ref Range Status  04/08/2020 >60 >60 mL/min Final   GFR calc non Af Amer  Date Value Ref Range Status  04/08/2020 >60 >60 mL/min Final   eGFR  Date Value Ref Range Status  02/26/2022 90 >59 mL/min/1.73 Final         Passed - Patient is not pregnant      Passed - Valid encounter within last 12 months    Recent Outpatient Visits           2 weeks ago Muscle spasm   [place], RPH-CPP   2 months ago Arthralgia of multiple joints   [place], Vernia Buff, NP   2 months ago Uncontrolled hypertension   [place], Jarome Matin, RPH-CPP   2 months ago Arthralgia of multiple joints   [place], Vernia Buff, NP   3 months ago Uncontrolled hypertension   [place], Vernia Buff, NP       Future Appointments             In 2 months Gildardo Pounds, NP [place]

## 2022-05-22 ENCOUNTER — Other Ambulatory Visit: Payer: Self-pay

## 2022-05-22 MED ORDER — PREGABALIN 75 MG PO CAPS
75.0000 mg | ORAL_CAPSULE | Freq: Two times a day (BID) | ORAL | 0 refills | Status: DC
  Filled 2022-05-22 – 2022-06-03 (×2): qty 60, 30d supply, fill #0

## 2022-05-28 ENCOUNTER — Other Ambulatory Visit: Payer: Self-pay

## 2022-05-29 ENCOUNTER — Other Ambulatory Visit: Payer: Self-pay

## 2022-06-03 ENCOUNTER — Other Ambulatory Visit: Payer: Self-pay

## 2022-06-29 ENCOUNTER — Other Ambulatory Visit: Payer: Self-pay

## 2022-06-29 ENCOUNTER — Other Ambulatory Visit: Payer: Self-pay | Admitting: Nurse Practitioner

## 2022-06-29 ENCOUNTER — Other Ambulatory Visit: Payer: Self-pay | Admitting: Family Medicine

## 2022-06-29 DIAGNOSIS — M62838 Other muscle spasm: Secondary | ICD-10-CM

## 2022-06-29 DIAGNOSIS — M2550 Pain in unspecified joint: Secondary | ICD-10-CM

## 2022-06-29 MED ORDER — METHOCARBAMOL 500 MG PO TABS
500.0000 mg | ORAL_TABLET | Freq: Four times a day (QID) | ORAL | 0 refills | Status: DC
  Filled 2022-06-29: qty 90, 23d supply, fill #0

## 2022-06-29 MED ORDER — PREGABALIN 75 MG PO CAPS
75.0000 mg | ORAL_CAPSULE | Freq: Two times a day (BID) | ORAL | 0 refills | Status: DC
  Filled 2022-06-29 – 2022-07-12 (×2): qty 60, 30d supply, fill #0

## 2022-07-12 ENCOUNTER — Other Ambulatory Visit: Payer: Self-pay | Admitting: Nurse Practitioner

## 2022-07-12 DIAGNOSIS — E785 Hyperlipidemia, unspecified: Secondary | ICD-10-CM

## 2022-07-13 ENCOUNTER — Other Ambulatory Visit: Payer: Self-pay

## 2022-07-13 MED ORDER — ATORVASTATIN CALCIUM 80 MG PO TABS
80.0000 mg | ORAL_TABLET | Freq: Every day | ORAL | 0 refills | Status: DC
  Filled 2022-07-13: qty 30, 30d supply, fill #0

## 2022-08-04 ENCOUNTER — Other Ambulatory Visit: Payer: Self-pay | Admitting: Nurse Practitioner

## 2022-08-04 ENCOUNTER — Ambulatory Visit: Payer: Self-pay | Admitting: Nurse Practitioner

## 2022-08-04 ENCOUNTER — Other Ambulatory Visit: Payer: Self-pay

## 2022-08-04 ENCOUNTER — Encounter: Payer: Self-pay | Admitting: Internal Medicine

## 2022-08-04 ENCOUNTER — Ambulatory Visit: Payer: Self-pay | Attending: Nurse Practitioner | Admitting: Internal Medicine

## 2022-08-04 ENCOUNTER — Other Ambulatory Visit: Payer: Self-pay | Admitting: Family Medicine

## 2022-08-04 VITALS — BP 150/90 | HR 71 | Temp 98.4°F | Ht 75.0 in | Wt 226.0 lb

## 2022-08-04 DIAGNOSIS — M7918 Myalgia, other site: Secondary | ICD-10-CM

## 2022-08-04 DIAGNOSIS — E1169 Type 2 diabetes mellitus with other specified complication: Secondary | ICD-10-CM

## 2022-08-04 DIAGNOSIS — M2550 Pain in unspecified joint: Secondary | ICD-10-CM

## 2022-08-04 DIAGNOSIS — M62838 Other muscle spasm: Secondary | ICD-10-CM

## 2022-08-04 DIAGNOSIS — E1159 Type 2 diabetes mellitus with other circulatory complications: Secondary | ICD-10-CM

## 2022-08-04 DIAGNOSIS — E1165 Type 2 diabetes mellitus with hyperglycemia: Secondary | ICD-10-CM | POA: Insufficient documentation

## 2022-08-04 DIAGNOSIS — E785 Hyperlipidemia, unspecified: Secondary | ICD-10-CM

## 2022-08-04 DIAGNOSIS — G8929 Other chronic pain: Secondary | ICD-10-CM

## 2022-08-04 DIAGNOSIS — Z2821 Immunization not carried out because of patient refusal: Secondary | ICD-10-CM

## 2022-08-04 DIAGNOSIS — M25562 Pain in left knee: Secondary | ICD-10-CM

## 2022-08-04 DIAGNOSIS — I152 Hypertension secondary to endocrine disorders: Secondary | ICD-10-CM

## 2022-08-04 DIAGNOSIS — M25561 Pain in right knee: Secondary | ICD-10-CM

## 2022-08-04 DIAGNOSIS — E1140 Type 2 diabetes mellitus with diabetic neuropathy, unspecified: Secondary | ICD-10-CM

## 2022-08-04 LAB — POCT GLYCOSYLATED HEMOGLOBIN (HGB A1C): HbA1c, POC (controlled diabetic range): 6.2 % (ref 0.0–7.0)

## 2022-08-04 LAB — GLUCOSE, POCT (MANUAL RESULT ENTRY): POC Glucose: 94 mg/dl (ref 70–99)

## 2022-08-04 MED ORDER — ATORVASTATIN CALCIUM 80 MG PO TABS
80.0000 mg | ORAL_TABLET | Freq: Every day | ORAL | 3 refills | Status: DC
  Filled 2022-08-04 – 2022-08-21 (×2): qty 30, 30d supply, fill #0
  Filled 2022-09-27: qty 30, 30d supply, fill #1
  Filled 2022-11-01 – 2022-11-02 (×2): qty 30, 30d supply, fill #2
  Filled 2022-12-14 – 2023-01-07 (×2): qty 30, 30d supply, fill #3

## 2022-08-04 MED ORDER — MELOXICAM 15 MG PO TABS
15.0000 mg | ORAL_TABLET | Freq: Every day | ORAL | 0 refills | Status: DC
  Filled 2022-08-04: qty 90, 90d supply, fill #0

## 2022-08-04 MED ORDER — METHOCARBAMOL 500 MG PO TABS
500.0000 mg | ORAL_TABLET | Freq: Four times a day (QID) | ORAL | 0 refills | Status: DC
  Filled 2022-08-04: qty 60, 15d supply, fill #0

## 2022-08-04 MED ORDER — AMLODIPINE BESYLATE 10 MG PO TABS
10.0000 mg | ORAL_TABLET | Freq: Every day | ORAL | 5 refills | Status: DC
  Filled 2022-08-04: qty 30, 30d supply, fill #0
  Filled 2022-08-21: qty 30, 30d supply, fill #1
  Filled 2022-09-27: qty 30, 30d supply, fill #2
  Filled 2022-11-01 – 2022-11-02 (×2): qty 30, 30d supply, fill #3
  Filled 2022-11-22: qty 30, 30d supply, fill #4
  Filled 2023-01-07: qty 30, 30d supply, fill #5

## 2022-08-04 MED ORDER — PREGABALIN 75 MG PO CAPS
75.0000 mg | ORAL_CAPSULE | Freq: Two times a day (BID) | ORAL | 2 refills | Status: DC
  Filled 2022-08-04 – 2022-08-21 (×2): qty 60, 30d supply, fill #0
  Filled 2022-09-27: qty 60, 30d supply, fill #1
  Filled 2022-11-01 – 2022-11-02 (×2): qty 60, 30d supply, fill #2

## 2022-08-04 NOTE — Patient Instructions (Addendum)
Your blood pressure is not at goal.  We have increased amlodipine to 10 mg daily. Once you have insurance, please call your primary provider Bertram Denver to let her know so that x-rays can be done of your knees and elbows and referral submitted to orthopedics.  She had referred you to Physical Medicine and Rehabilitation [address] [place], [place] [address] Ph# [phone] They had tried to reach you.

## 2022-08-04 NOTE — Addendum Note (Signed)
Addended by: Jonah Blue B on: 08/04/2022 06:28 PM   Modules accepted: Orders

## 2022-08-04 NOTE — Progress Notes (Signed)
Patient ID: Anthony Schmidt, male    DOB: 20-Jul-1981  MRN: [id number]  CC: Diabetes (DM f/u. Elly Modena shooting pain on elbow, knees, feet X6 mo. /No to flu vax.)   Subjective: Anthony Schmidt is a 41 y.o. male who presents for chronic ds management.  PCP is NP Raul Del who is currently on vacation.  Last seen 03/2022 His concerns today include:  Patient with history of hypertension, hyperlipidemia, DM, polyarthralgia  HYPERTENSION Currently taking: see medication list - Norvasc 5 mg and Diovan 160 mg daily Med Adherence: _0  Yes    _1  No Medication side effects: _2  Yes    _3  No Adherence with salt restriction: _4  Yes    _5  No Home Monitoring?: _6  Yes    _7  No Monitoring Frequency:  1x/day Home BP results range:   SOB? _8  Yes    _9  No Chest Pain?: _10  Yes    _11  No  DM: Results for orders placed or performed in visit on 08/04/22  POCT glucose (manual entry)  Result Value Ref Range   POC Glucose 94 70 - 99 mg/dl  POCT glycosylated hemoglobin (Hb A1C)  Result Value Ref Range   Hemoglobin A1C     HbA1c POC (<> result, manual entry)     HbA1c, POC (prediabetic range)     HbA1c, POC (controlled diabetic range) 6.2 0.0 - 7.0 %  Taking Humulin 70/30 10 units BID and Metformin 500 mg XR BID Checks BS 3 x/day - Gives range of 117-122 Doing good with eating habits but decrease appetite in past 3 wks.  Not sure why. No increase stress on job or at home.  +aching in body Over due for eye exam, no insurance  HL:  taking and tolerating Lipitor  Constant pain on elbows/knees and feet.  Knees swell occasionally.   Taking Meloxicam and Lyrica.  Helped initially but not much now..   Had TKR surgeries BL 1996/7 PCP had done some basic workup including sed rate, uric acid level and rheumatoid factor check.  These were normal.  HM:  declines flu shot Patient Active Problem List   Diagnosis Date Noted   Type 2 diabetes mellitus with hyperglycemia, with long-term current use of insulin ([place])  08/04/2022   Syncope and collapse 04/07/2020   Alcohol intoxication with delirium ([place])    DKA (diabetic ketoacidoses) 01/17/2020   Hyperkalemia 01/17/2020     Current Outpatient Medications on File Prior to Visit  Medication Sig Dispense Refill   acetaminophen (TYLENOL) 500 MG tablet Take 1,000-2,000 mg by mouth daily as needed for headache (pain).     blood glucose meter kit and supplies KIT Dispense based on patient and insurance preference. Use up to four times daily as directed. (FOR ICD-9 250.00, 250.01). 1 each 0   Blood Glucose Monitoring Suppl (TRUE METRIX METER) w/Device KIT Use as instructed to check blood sugar TID. 1 kit 0   dextrose (GLUTOSE) 40 % GEL Take 37.5 g by mouth once as needed for up to 1 dose for low blood sugar. 37.5 g 1   glucose blood (TRUE METRIX BLOOD GLUCOSE TEST) test strip Use as instructed to check blood sugar three times daily 100 each 2   insulin isophane & regular human KwikPen (HUMULIN 70/30 MIX) (70-30) 100 UNIT/ML KwikPen INJECT 10 UNITS INTO THE SKIN 2 (TWO) TIMES DAILY. 15 mL 11   Insulin Pen Needle (TRUEPLUS 5-BEVEL PEN NEEDLES) 31G X 5 MM MISC USE 2 TIMES A DAY AS DIRECTED 100 each 6  meloxicam (MOBIC) 15 MG tablet Take 1 tablet (15 mg total) by mouth daily. 90 tablet 0   metFORMIN (GLUCOPHAGE-XR) 500 MG 24 hr tablet TAKE 1 TABLET (500 MG TOTAL) BY MOUTH IN THE MORNING AND AT BEDTIME. 180 tablet 1   methocarbamol (ROBAXIN) 500 MG tablet Take 1 tablet (500 mg total) by mouth 4 (four) times daily. 90 tablet 0   pregabalin (LYRICA) 75 MG capsule Take 1 capsule (75 mg total) by mouth 2 (two) times daily. 60 capsule 0   TRUEplus Lancets 28G MISC Use as instructed to check blood sugar TID. 100 each 6   valsartan (DIOVAN) 160 MG tablet Take 1 tablet (160 mg total) by mouth once daily. 90 tablet 3   No current facility-administered medications on file prior to visit.    No Known Allergies  Social History   Socioeconomic History   Marital status:  Significant Other    Spouse name: Not on file   Number of children: Not on file   Years of education: Not on file   Highest education level: Not on file  Occupational History   Not on file  Tobacco Use   Smoking status: Never   Smokeless tobacco: Never  Vaping Use   Vaping Use: Never used  Substance and Sexual Activity   Alcohol use: Yes    Alcohol/week: 6.0 standard drinks of alcohol    Types: 6 Cans of beer per week    Comment: occassionally   Drug use: Never   Sexual activity: Yes  Other Topics Concern   Not on file  Social History Narrative   Not on file   Social Determinants of Health   Financial Resource Strain: Not on file  Food Insecurity: Not on file  Transportation Needs: Not on file  Physical Activity: Not on file  Stress: Not on file  Social Connections: Not on file  Intimate Partner Violence: Not on file    Family History  Problem Relation Age of Onset   Diabetes Mother    Hypertension Mother    Diabetes Father    Hypertension Father     Past Surgical History:  Procedure Laterality Date   KNEE ARTHROSCOPY Bilateral    KNEE SURGERY Bilateral     ROS: Review of Systems Negative except as stated above  PHYSICAL EXAM: BP (!) 150/90   Pulse 71   Temp 98.4 F (36.9 C) (Oral)   Ht _0  (1.905 m)   Wt 226 lb (102.5 kg)   SpO2 99%   BMI 28.25 kg/m   Physical Exam   General appearance - alert, well appearing, and in no distress Mental status - normal mood, behavior, speech, dress, motor activity, and thought processes Neck - supple, no significant adenopathy Chest - clear to auscultation, no wheezes, rales or rhonchi, symmetric air entry Heart - normal rate, regular rhythm, normal S1, S2, no murmurs, rubs, clicks or gallops Musculoskeletal -elbows: Positive joint enlargement.  Mild tenderness on palpation of the medial epicondyles Knees: Joints are not enlarged.  There is some audible clicking heard in the joints with passive range of  motion. Extremities -trace lower extremity edema.     Latest Ref Rng & Units 02/26/2022    8:41 AM 01/14/2022    3:39 PM 04/30/2021    4:39 PM  CMP  Glucose 70 - 99 mg/dL 157  108  93   BUN 6 - 24 mg/dL _1 Creatinine 0.76 - 1.27 mg/dL 1.06  1.04  0.99   Sodium 134 - 144 mmol/L 134  139  140   Potassium 3.5 - 5.2 mmol/L 4.1  3.9  3.9   Chloride 96 - 106 mmol/L 102  105  101   CO2 20 - 29 mmol/L _0 Calcium 8.7 - 10.2 mg/dL 9.5  9.0  9.1   Total Protein 6.0 - 8.5 g/dL  7.1  6.9   Total Bilirubin 0.0 - 1.2 mg/dL  0.5  0.5   Alkaline Phos 44 - 121 IU/L  78  75   AST 0 - 40 IU/L  58  30   ALT 0 - 44 IU/L  60  26    Lipid Panel     Component Value Date/Time   CHOL 155 01/14/2022 1539   TRIG 568 (HH) 01/14/2022 1539   HDL 34 (L) 01/14/2022 1539   CHOLHDL 4.6 01/14/2022 1539   LDLCALC 41 01/14/2022 1539    CBC    Component Value Date/Time   WBC 5.3 01/14/2022 1539   WBC 5.6 04/08/2020 0726   RBC 4.53 01/14/2022 1539   RBC 4.33 04/08/2020 0726   HGB 13.8 01/14/2022 1539   HCT 40.9 01/14/2022 1539   PLT 180 01/14/2022 1539   MCV 90 01/14/2022 1539   MCH 30.5 01/14/2022 1539   MCH 29.3 04/08/2020 0726   MCHC 33.7 01/14/2022 1539   MCHC 31.9 04/08/2020 0726   RDW 13.8 01/14/2022 1539   LYMPHSABS 2.0 04/30/2021 1639   MONOABS 0.5 04/07/2020 0106   EOSABS 0.2 04/30/2021 1639   BASOSABS 0.0 04/30/2021 1639    ASSESSMENT AND PLAN:  1. Controlled type 2 diabetes mellitus with neuropathy ([place]) Patient to continue current dose of Humulin 70/30 10 units twice a day and metformin 500 units twice a day.  Encourage healthy eating habits. - POCT glucose (manual entry) - POCT glycosylated hemoglobin (Hb A1C)  2. Hypertension associated with diabetes ([place]) Not at goal.  Increase Norvasc to 10 mg daily.  Continue Diovan 160 mg daily. - amLODipine (NORVASC) 10 MG tablet; Take 1 tablet (10 mg total) by mouth daily.  Dispense: 30 tablet; Refill: 5  3.  Hyperlipidemia associated with type 2 diabetes mellitus (HCC) Continue atorvastatin. - Hepatic Function Panel  4. Polyarthralgia Patient states he will have insurance on within the next several weeks.  At that time I think he needs to have x-rays done of the knees and elbows and refer to orthopedics.  He will let his PCP know once he has insurance  5. Chronic pain of both knees See #4 above  6. Influenza vaccination declined   Patient was given the opportunity to ask questions.  Patient verbalized understanding of the plan and was able to repeat key elements of the plan.   This documentation was completed using Radio producer.  Any transcriptional errors are unintentional.  Orders Placed This Encounter  Procedures   Hepatic Function Panel   POCT glucose (manual entry)   POCT glycosylated hemoglobin (Hb A1C)     Requested Prescriptions   Signed Prescriptions Disp Refills   amLODipine (NORVASC) 10 MG tablet 30 tablet 5    Sig: Take 1 tablet (10 mg total) by mouth daily.    Return in about 3 months (around 11/03/2022) for Give 3 mth f/u with his PCP Geryl Rankins.  Karle Plumber, MD, FACP

## 2022-08-05 ENCOUNTER — Other Ambulatory Visit: Payer: Self-pay

## 2022-08-05 LAB — HEPATIC FUNCTION PANEL
ALT: 44 IU/L (ref 0–44)
AST: 44 IU/L — ABNORMAL HIGH (ref 0–40)
Albumin: 4.5 g/dL (ref 4.1–5.1)
Alkaline Phosphatase: 69 IU/L (ref 44–121)
Bilirubin Total: 0.4 mg/dL (ref 0.0–1.2)
Bilirubin, Direct: 0.16 mg/dL (ref 0.00–0.40)
Total Protein: 7 g/dL (ref 6.0–8.5)

## 2022-08-11 ENCOUNTER — Ambulatory Visit: Payer: Self-pay | Admitting: Nurse Practitioner

## 2022-08-21 ENCOUNTER — Other Ambulatory Visit: Payer: Self-pay

## 2022-08-21 ENCOUNTER — Other Ambulatory Visit: Payer: Self-pay | Admitting: Nurse Practitioner

## 2022-08-21 ENCOUNTER — Other Ambulatory Visit: Payer: Self-pay | Admitting: Internal Medicine

## 2022-08-21 DIAGNOSIS — E1165 Type 2 diabetes mellitus with hyperglycemia: Secondary | ICD-10-CM

## 2022-08-21 DIAGNOSIS — M2550 Pain in unspecified joint: Secondary | ICD-10-CM

## 2022-08-21 MED ORDER — METHOCARBAMOL 500 MG PO TABS
500.0000 mg | ORAL_TABLET | Freq: Four times a day (QID) | ORAL | 3 refills | Status: DC
  Filled 2022-08-21: qty 60, 15d supply, fill #0
  Filled 2022-09-27: qty 60, 15d supply, fill #1
  Filled 2022-11-01 – 2022-11-02 (×2): qty 60, 15d supply, fill #2
  Filled 2022-11-22: qty 60, 15d supply, fill #3

## 2022-08-21 MED ORDER — METFORMIN HCL ER 500 MG PO TB24
500.0000 mg | ORAL_TABLET | Freq: Two times a day (BID) | ORAL | 0 refills | Status: DC
  Filled 2022-08-21: qty 180, 90d supply, fill #0

## 2022-08-21 NOTE — Telephone Encounter (Signed)
Requested Prescriptions  Pending Prescriptions Disp Refills   metFORMIN (GLUCOPHAGE-XR) 500 MG 24 hr tablet 180 tablet 0    Sig: TAKE 1 TABLET (500 MG TOTAL) BY MOUTH IN THE MORNING AND AT BEDTIME.     Endocrinology:  Diabetes - Biguanides Failed - 08/21/2022 10:39 AM      Failed - B12 Level in normal range and within 720 days    No results found for: "VITAMINB12"       Failed - CBC within normal limits and completed in the last 12 months    WBC  Date Value Ref Range Status  01/14/2022 5.3 3.4 - 10.8 x10E3/uL Final  04/08/2020 5.6 4.0 - 10.5 K/uL Final   RBC  Date Value Ref Range Status  01/14/2022 4.53 4.14 - 5.80 x10E6/uL Final  04/08/2020 4.33 4.22 - 5.81 MIL/uL Final   Hemoglobin  Date Value Ref Range Status  01/14/2022 13.8 13.0 - 17.7 g/dL Final   Hematocrit  Date Value Ref Range Status  01/14/2022 40.9 37.5 - 51.0 % Final   MCHC  Date Value Ref Range Status  01/14/2022 33.7 31.5 - 35.7 g/dL Final  04/08/2020 31.9 30.0 - 36.0 g/dL Final   [facility]  Date Value Ref Range Status  01/14/2022 30.5 26.6 - 33.0 pg Final  04/08/2020 29.3 26.0 - 34.0 pg Final   MCV  Date Value Ref Range Status  01/14/2022 90 79 - 97 fL Final   No results found for: "PLTCOUNTKUC", "LABPLAT", "POCPLA" RDW  Date Value Ref Range Status  01/14/2022 13.8 11.6 - 15.4 % Final         Passed - Cr in normal range and within 360 days    Creatinine, Ser  Date Value Ref Range Status  02/26/2022 1.06 0.76 - 1.27 mg/dL Final         Passed - HBA1C is between 0 and 7.9 and within 180 days    HbA1c, POC (controlled diabetic range)  Date Value Ref Range Status  08/04/2022 6.2 0.0 - 7.0 % Final         Passed - eGFR in normal range and within 360 days    GFR calc Af Amer  Date Value Ref Range Status  04/08/2020 >60 >60 mL/min Final   GFR calc non Af Amer  Date Value Ref Range Status  04/08/2020 >60 >60 mL/min Final   eGFR  Date Value Ref Range Status  02/26/2022 90 >59 mL/min/1.73 Final          Passed - Valid encounter within last 6 months    Recent Outpatient Visits           2 weeks ago Controlled type 2 diabetes mellitus with neuropathy [facility])   [place] Ladell Pier, MD   3 months ago Muscle spasm   [place], Jarome Matin, RPH-CPP   5 months ago Arthralgia of multiple joints   [place], Vernia Buff, NP   5 months ago Uncontrolled hypertension   [place], Jarome Matin, RPH-CPP   5 months ago Arthralgia of multiple joints   [place], Zelda W, NP       Future Appointments             In 2 months Gildardo Pounds, NP [place]

## 2022-08-24 ENCOUNTER — Other Ambulatory Visit: Payer: Self-pay

## 2022-09-27 ENCOUNTER — Other Ambulatory Visit: Payer: Self-pay | Admitting: Internal Medicine

## 2022-09-27 ENCOUNTER — Other Ambulatory Visit: Payer: Self-pay | Admitting: Nurse Practitioner

## 2022-09-27 DIAGNOSIS — M2550 Pain in unspecified joint: Secondary | ICD-10-CM

## 2022-09-27 DIAGNOSIS — E1165 Type 2 diabetes mellitus with hyperglycemia: Secondary | ICD-10-CM

## 2022-09-28 ENCOUNTER — Other Ambulatory Visit: Payer: Self-pay

## 2022-09-28 MED ORDER — METFORMIN HCL ER 500 MG PO TB24
500.0000 mg | ORAL_TABLET | Freq: Two times a day (BID) | ORAL | 0 refills | Status: DC
  Filled 2022-09-28 – 2022-11-01 (×2): qty 180, 90d supply, fill #0
  Filled 2022-11-02: qty 60, 30d supply, fill #0
  Filled 2022-12-14 – 2023-01-07 (×2): qty 60, 30d supply, fill #1
  Filled 2023-02-01: qty 60, 30d supply, fill #2

## 2022-09-28 NOTE — Telephone Encounter (Signed)
Unable to refill per protocol, Rx request is too soon. Last refill 08/04/22 for 90 days.  Requested Prescriptions  Pending Prescriptions Disp Refills   meloxicam (MOBIC) 15 MG tablet 90 tablet 0    Sig: Take 1 tablet (15 mg total) by mouth daily.     Analgesics:  COX2 Inhibitors Failed - 09/27/2022  9:24 PM      Failed - Manual Review: Labs are only required if the patient has taken medication for more than 8 weeks.      Failed - AST in normal range and within 360 days    AST  Date Value Ref Range Status  08/04/2022 44 (H) 0 - 40 IU/L Final         Passed - HGB in normal range and within 360 days    Hemoglobin  Date Value Ref Range Status  01/14/2022 13.8 13.0 - 17.7 g/dL Final         Passed - Cr in normal range and within 360 days    Creatinine, Ser  Date Value Ref Range Status  02/26/2022 1.06 0.76 - 1.27 mg/dL Final         Passed - HCT in normal range and within 360 days    Hematocrit  Date Value Ref Range Status  01/14/2022 40.9 37.5 - 51.0 % Final         Passed - ALT in normal range and within 360 days    ALT  Date Value Ref Range Status  08/04/2022 44 0 - 44 IU/L Final         Passed - eGFR is 30 or above and within 360 days    GFR calc Af Amer  Date Value Ref Range Status  04/08/2020 >60 >60 mL/min Final   GFR calc non Af Amer  Date Value Ref Range Status  04/08/2020 >60 >60 mL/min Final   eGFR  Date Value Ref Range Status  02/26/2022 90 >59 mL/min/1.73 Final         Passed - Patient is not pregnant      Passed - Valid encounter within last 12 months    Recent Outpatient Visits           1 month ago Controlled type 2 diabetes mellitus with neuropathy [facility])   [place] Ladell Pier, MD   4 months ago Muscle spasm   [place], Porter L, RPH-CPP   6 months ago Arthralgia of multiple joints   [place], Vernia Buff, NP   6 months ago Uncontrolled hypertension   [place], Pekin L, RPH-CPP   7 months ago Arthralgia of multiple joints   [place], Vernia Buff, NP       Future Appointments             In 1 month Gildardo Pounds, NP [place]

## 2022-10-01 ENCOUNTER — Other Ambulatory Visit: Payer: Self-pay

## 2022-10-02 ENCOUNTER — Other Ambulatory Visit: Payer: Self-pay

## 2022-10-07 ENCOUNTER — Other Ambulatory Visit: Payer: Self-pay

## 2022-10-09 ENCOUNTER — Other Ambulatory Visit: Payer: Self-pay

## 2022-10-13 ENCOUNTER — Other Ambulatory Visit: Payer: Self-pay

## 2022-10-20 ENCOUNTER — Other Ambulatory Visit: Payer: Self-pay

## 2022-11-01 ENCOUNTER — Other Ambulatory Visit: Payer: Self-pay | Admitting: Internal Medicine

## 2022-11-01 DIAGNOSIS — M2550 Pain in unspecified joint: Secondary | ICD-10-CM

## 2022-11-02 ENCOUNTER — Other Ambulatory Visit: Payer: Self-pay

## 2022-11-03 ENCOUNTER — Ambulatory Visit: Payer: Self-pay | Admitting: Nurse Practitioner

## 2022-11-04 ENCOUNTER — Other Ambulatory Visit: Payer: Self-pay

## 2022-11-06 ENCOUNTER — Other Ambulatory Visit: Payer: Self-pay

## 2022-11-23 ENCOUNTER — Other Ambulatory Visit: Payer: Self-pay

## 2022-11-26 ENCOUNTER — Other Ambulatory Visit: Payer: Self-pay

## 2022-12-14 ENCOUNTER — Other Ambulatory Visit: Payer: Self-pay | Admitting: Nurse Practitioner

## 2022-12-14 ENCOUNTER — Other Ambulatory Visit: Payer: Self-pay | Admitting: Internal Medicine

## 2022-12-14 DIAGNOSIS — M2550 Pain in unspecified joint: Secondary | ICD-10-CM

## 2022-12-15 ENCOUNTER — Other Ambulatory Visit: Payer: Self-pay

## 2022-12-15 MED ORDER — PREGABALIN 75 MG PO CAPS
75.0000 mg | ORAL_CAPSULE | Freq: Two times a day (BID) | ORAL | 2 refills | Status: DC
  Filled 2022-12-15 – 2023-01-07 (×2): qty 60, 30d supply, fill #0
  Filled 2023-02-01: qty 60, 30d supply, fill #1

## 2022-12-15 NOTE — Telephone Encounter (Signed)
Requested medication (s) are due for refill today: For review  Requested medication (s) are on the active medication list: yes    Last refill: 08/21/22  #60  3 refills  Future visit scheduled  no  Notes to clinic:Not delegated, please review. Thank you.  Requested Prescriptions  Pending Prescriptions Disp Refills   methocarbamol (ROBAXIN) 500 MG tablet 60 tablet 3    Sig: Take 1 tablet (500 mg total) by mouth 4 (four) times daily. Will need office visit for refills.     Not Delegated - Analgesics:  Muscle Relaxants Failed - 12/14/2022 10:10 PM      Failed - This refill cannot be delegated      Passed - Valid encounter within last 6 months    Recent Outpatient Visits           4 months ago Controlled type 2 diabetes mellitus with neuropathy [facility])   [place] Marcine Matar, MD   7 months ago Muscle spasm   [facility] [place], Cornelius Moras, RPH-CPP   9 months ago Arthralgia of multiple joints   [facility] Claiborne Rigg, NP   9 months ago Uncontrolled hypertension   [facility] [place], Cornelius Moras, RPH-CPP   9 months ago Arthralgia of multiple joints   [facility] [place], Shea Stakes, NP

## 2022-12-16 ENCOUNTER — Other Ambulatory Visit: Payer: Self-pay

## 2022-12-16 MED ORDER — METHOCARBAMOL 500 MG PO TABS
500.0000 mg | ORAL_TABLET | Freq: Four times a day (QID) | ORAL | 0 refills | Status: DC
  Filled 2022-12-16 – 2023-01-07 (×2): qty 60, 15d supply, fill #0

## 2022-12-22 ENCOUNTER — Other Ambulatory Visit: Payer: Self-pay

## 2022-12-30 ENCOUNTER — Other Ambulatory Visit: Payer: Self-pay

## 2023-01-07 ENCOUNTER — Other Ambulatory Visit: Payer: Self-pay

## 2023-01-07 ENCOUNTER — Other Ambulatory Visit: Payer: Self-pay | Admitting: Internal Medicine

## 2023-01-07 DIAGNOSIS — M2550 Pain in unspecified joint: Secondary | ICD-10-CM

## 2023-01-08 ENCOUNTER — Other Ambulatory Visit: Payer: Self-pay

## 2023-01-13 ENCOUNTER — Other Ambulatory Visit: Payer: Self-pay

## 2023-01-27 ENCOUNTER — Ambulatory Visit: Payer: Self-pay | Admitting: Physician Assistant

## 2023-02-01 ENCOUNTER — Other Ambulatory Visit: Payer: Self-pay | Admitting: Internal Medicine

## 2023-02-01 ENCOUNTER — Other Ambulatory Visit: Payer: Self-pay

## 2023-02-01 ENCOUNTER — Other Ambulatory Visit: Payer: Self-pay | Admitting: Family Medicine

## 2023-02-01 ENCOUNTER — Other Ambulatory Visit: Payer: Self-pay | Admitting: Nurse Practitioner

## 2023-02-01 DIAGNOSIS — E785 Hyperlipidemia, unspecified: Secondary | ICD-10-CM

## 2023-02-01 DIAGNOSIS — I152 Hypertension secondary to endocrine disorders: Secondary | ICD-10-CM

## 2023-02-01 DIAGNOSIS — I10 Essential (primary) hypertension: Secondary | ICD-10-CM

## 2023-02-01 MED ORDER — VALSARTAN 160 MG PO TABS
160.0000 mg | ORAL_TABLET | Freq: Every day | ORAL | 0 refills | Status: DC
  Filled 2023-02-01: qty 30, 30d supply, fill #0

## 2023-02-01 MED ORDER — AMLODIPINE BESYLATE 10 MG PO TABS
10.0000 mg | ORAL_TABLET | Freq: Every day | ORAL | 0 refills | Status: DC
  Filled 2023-02-01: qty 30, 30d supply, fill #0

## 2023-02-01 MED ORDER — ATORVASTATIN CALCIUM 80 MG PO TABS
80.0000 mg | ORAL_TABLET | Freq: Every day | ORAL | 0 refills | Status: DC
  Filled 2023-02-01: qty 30, 30d supply, fill #0

## 2023-02-08 ENCOUNTER — Other Ambulatory Visit: Payer: Self-pay

## 2023-02-17 ENCOUNTER — Ambulatory Visit: Payer: Self-pay | Admitting: Physician Assistant

## 2023-03-09 ENCOUNTER — Other Ambulatory Visit: Payer: Self-pay | Admitting: Internal Medicine

## 2023-03-09 ENCOUNTER — Other Ambulatory Visit: Payer: Self-pay | Admitting: Family Medicine

## 2023-03-09 DIAGNOSIS — Z794 Long term (current) use of insulin: Secondary | ICD-10-CM

## 2023-03-09 DIAGNOSIS — E785 Hyperlipidemia, unspecified: Secondary | ICD-10-CM

## 2023-03-09 DIAGNOSIS — I10 Essential (primary) hypertension: Secondary | ICD-10-CM

## 2023-03-09 DIAGNOSIS — M2550 Pain in unspecified joint: Secondary | ICD-10-CM

## 2023-03-09 DIAGNOSIS — I152 Hypertension secondary to endocrine disorders: Secondary | ICD-10-CM

## 2023-03-10 ENCOUNTER — Other Ambulatory Visit: Payer: Self-pay

## 2023-03-10 MED ORDER — METHOCARBAMOL 500 MG PO TABS
500.0000 mg | ORAL_TABLET | Freq: Four times a day (QID) | ORAL | 0 refills | Status: DC
  Filled 2023-03-10: qty 60, 15d supply, fill #0

## 2023-03-10 MED ORDER — VALSARTAN 160 MG PO TABS
160.0000 mg | ORAL_TABLET | Freq: Every day | ORAL | 0 refills | Status: DC
  Filled 2023-03-10: qty 30, 30d supply, fill #0

## 2023-03-10 MED ORDER — AMLODIPINE BESYLATE 10 MG PO TABS
10.0000 mg | ORAL_TABLET | Freq: Every day | ORAL | 0 refills | Status: DC
  Filled 2023-03-10: qty 30, 30d supply, fill #0

## 2023-03-10 MED ORDER — ATORVASTATIN CALCIUM 80 MG PO TABS
80.0000 mg | ORAL_TABLET | Freq: Every day | ORAL | 0 refills | Status: DC
  Filled 2023-03-10: qty 30, 30d supply, fill #0

## 2023-03-10 MED ORDER — MELOXICAM 15 MG PO TABS
15.0000 mg | ORAL_TABLET | Freq: Every day | ORAL | 0 refills | Status: DC
  Filled 2023-03-10: qty 30, 30d supply, fill #0

## 2023-03-10 MED ORDER — METFORMIN HCL ER 500 MG PO TB24
500.0000 mg | ORAL_TABLET | Freq: Two times a day (BID) | ORAL | 0 refills | Status: DC
  Filled 2023-03-10: qty 60, 30d supply, fill #0

## 2023-03-17 ENCOUNTER — Other Ambulatory Visit: Payer: Self-pay

## 2023-03-18 ENCOUNTER — Ambulatory Visit: Payer: Self-pay | Attending: Physician Assistant | Admitting: Physician Assistant

## 2023-03-18 ENCOUNTER — Encounter: Payer: Self-pay | Admitting: Physician Assistant

## 2023-03-18 ENCOUNTER — Other Ambulatory Visit: Payer: Self-pay

## 2023-03-18 VITALS — BP 150/94 | HR 76 | Wt 222.8 lb

## 2023-03-18 DIAGNOSIS — E1159 Type 2 diabetes mellitus with other circulatory complications: Secondary | ICD-10-CM

## 2023-03-18 DIAGNOSIS — E1165 Type 2 diabetes mellitus with hyperglycemia: Secondary | ICD-10-CM

## 2023-03-18 DIAGNOSIS — M2550 Pain in unspecified joint: Secondary | ICD-10-CM

## 2023-03-18 DIAGNOSIS — E785 Hyperlipidemia, unspecified: Secondary | ICD-10-CM

## 2023-03-18 DIAGNOSIS — I152 Hypertension secondary to endocrine disorders: Secondary | ICD-10-CM

## 2023-03-18 DIAGNOSIS — Z794 Long term (current) use of insulin: Secondary | ICD-10-CM

## 2023-03-18 DIAGNOSIS — I10 Essential (primary) hypertension: Secondary | ICD-10-CM

## 2023-03-18 DIAGNOSIS — E1169 Type 2 diabetes mellitus with other specified complication: Secondary | ICD-10-CM

## 2023-03-18 LAB — POCT GLYCOSYLATED HEMOGLOBIN (HGB A1C): HbA1c, POC (controlled diabetic range): 7.2 % — AB (ref 0.0–7.0)

## 2023-03-18 LAB — GLUCOSE, POCT (MANUAL RESULT ENTRY): POC Glucose: 120 mg/dl — AB (ref 70–99)

## 2023-03-18 MED ORDER — MELOXICAM 15 MG PO TABS
15.0000 mg | ORAL_TABLET | Freq: Every day | ORAL | 0 refills | Status: DC
  Filled 2023-03-18: qty 30, 30d supply, fill #0

## 2023-03-18 MED ORDER — METFORMIN HCL ER 500 MG PO TB24
1000.0000 mg | ORAL_TABLET | Freq: Two times a day (BID) | ORAL | 3 refills | Status: DC
  Filled 2023-03-18: qty 120, 30d supply, fill #0
  Filled 2023-04-22 (×2): qty 120, 30d supply, fill #1
  Filled 2023-05-28 – 2023-06-14 (×3): qty 120, 30d supply, fill #2
  Filled 2023-07-18 – 2023-07-19 (×2): qty 120, 30d supply, fill #3

## 2023-03-18 MED ORDER — VALSARTAN 160 MG PO TABS
160.0000 mg | ORAL_TABLET | Freq: Every day | ORAL | 1 refills | Status: DC
  Filled 2023-03-18: qty 90, 90d supply, fill #0
  Filled 2023-05-28 – 2023-06-13 (×2): qty 90, 90d supply, fill #1
  Filled 2023-06-14: qty 30, 30d supply, fill #1
  Filled 2023-07-18 – 2023-07-19 (×2): qty 30, 30d supply, fill #2

## 2023-03-18 MED ORDER — METHOCARBAMOL 500 MG PO TABS
500.0000 mg | ORAL_TABLET | Freq: Four times a day (QID) | ORAL | 1 refills | Status: DC
  Filled 2023-03-18: qty 90, 23d supply, fill #0
  Filled 2023-04-22 (×2): qty 90, 23d supply, fill #1

## 2023-03-18 MED ORDER — ATORVASTATIN CALCIUM 80 MG PO TABS
80.0000 mg | ORAL_TABLET | Freq: Every day | ORAL | 1 refills | Status: DC
  Filled 2023-03-18: qty 90, 90d supply, fill #0
  Filled 2023-05-28 – 2023-06-13 (×2): qty 90, 90d supply, fill #1
  Filled 2023-06-14: qty 30, 30d supply, fill #1
  Filled 2023-07-18 – 2023-07-19 (×2): qty 30, 30d supply, fill #2
  Filled 2023-08-23 – 2023-09-17 (×2): qty 30, 30d supply, fill #3

## 2023-03-18 MED ORDER — AMLODIPINE BESYLATE 10 MG PO TABS
10.0000 mg | ORAL_TABLET | Freq: Every day | ORAL | 0 refills | Status: DC
  Filled 2023-03-18: qty 30, 30d supply, fill #0

## 2023-03-18 NOTE — Progress Notes (Signed)
Patient ID: Anthony Schmidt, male   DOB: 1981/07/07, 42 y.o.   MRN: [id number]   Anthony Schmidt, is a 42 y.o. male  [id number]  [id number]  DOB - Sep 24, 1980  Chief Complaint  Patient presents with   Generalized Body Aches   Diabetes   Medication Refill       Subjective:   Anthony Schmidt is a 42 y.o. male here today for med RF.  He has had previous work up of joint swelling and pain about 1 year ago.  He continues to have joints swell and become painful for a few days then it resolves on it's own.  No injuries to joints.  No fevers.  No tick bites.  ANA/RA/crp/uric acid negative.  Minimal elevation of sed rate at 29.  Says he also feels sore and has body aches all of the time.  Not checking blood sugars  No problems updated.  ALLERGIES: No Known Allergies  PAST MEDICAL HISTORY: Past Medical History:  Diagnosis Date   Diabetes mellitus, new onset (HCC) 01/2020   Hyperlipidemia    Hypertension     MEDICATIONS AT HOME: Prior to Admission medications   Medication Sig Start Date End Date Taking? Authorizing Provider  acetaminophen (TYLENOL) 500 MG tablet Take 1,000-2,000 mg by mouth daily as needed for headache (pain).   Yes [provider]  blood glucose meter kit and supplies KIT Dispense based on patient and insurance preference. Use up to four times daily as directed. (FOR ICD-9 250.00, 250.01). 01/20/20  Yes Regalado, Belkys A, MD  Blood Glucose Monitoring Suppl (TRUE METRIX METER) w/Device KIT Use as instructed to check blood sugar TID. 02/22/20  Yes Newlin, Enobong, MD  dextrose (GLUTOSE) 40 % GEL Take 37.5 g by mouth once as needed for up to 1 dose for low blood sugar. 04/24/20  Yes Claiborne Rigg, NP  glucose blood (TRUE METRIX BLOOD GLUCOSE TEST) test strip Use as instructed to check blood sugar three times daily 03/18/22  Yes Newlin, Enobong, MD  insulin isophane & regular human KwikPen (HUMULIN 70/30 MIX) (70-30) 100 UNIT/ML KwikPen INJECT 10 UNITS INTO THE SKIN 2  (TWO) TIMES DAILY. 01/14/22 04/03/23 Yes Claiborne Rigg, NP  Insulin Pen Needle (TRUEPLUS 5-BEVEL PEN NEEDLES) 31G X 5 MM MISC USE 2 TIMES A DAY AS DIRECTED 01/14/22  Yes Claiborne Rigg, NP  pregabalin (LYRICA) 75 MG capsule Take 1 capsule (75 mg total) by mouth 2 (two) times daily. 12/15/22  Yes Claiborne Rigg, NP  TRUEplus Lancets 28G MISC Use as instructed to check blood sugar TID. 02/22/20  Yes Hoy Register, MD  amLODipine (NORVASC) 10 MG tablet Take 1 tablet (10 mg total) by mouth daily. 03/18/23   Anders Simmonds, PA-C  atorvastatin (LIPITOR) 80 MG tablet Take 1 tablet (80 mg total) by mouth daily. 03/18/23   Anders Simmonds, PA-C  meloxicam (MOBIC) 15 MG tablet Take 1 tablet (15 mg total) by mouth daily. 03/18/23   Anders Simmonds, PA-C  metFORMIN (GLUCOPHAGE-XR) 500 MG 24 hr tablet Take 2 tablets (1,000 mg total) by mouth in the morning and at bedtime. 03/18/23   Anders Simmonds, PA-C  methocarbamol (ROBAXIN) 500 MG tablet Take 1 tablet (500 mg total) by mouth 4 (four) times daily. 03/18/23   Anders Simmonds, PA-C  valsartan (DIOVAN) 160 MG tablet Take 1 tablet (160 mg total) by mouth once daily. 03/18/23   Anders Simmonds, PA-C    ROS: Neg HEENT Neg resp Neg cardiac  Neg GI Neg GU Neg MS Neg psych Neg neuro  Objective:   Vitals:   03/18/23 1611  BP: (!) 150/94  Pulse: 76  SpO2: 97%  Weight: 222 lb 12.8 oz (101.1 kg)   Exam General appearance : Awake, alert, not in any distress. Speech Clear. Not toxic looking HEENT: Atraumatic and Normocephalic Neck: Supple, no JVD. No cervical lymphadenopathy.  Chest: Good air entry bilaterally, CTAB.  No rales/rhonchi/wheezing CVS: S1 S2 regular, no murmurs.  No joints actively swollen Extremities: B/L Lower Ext shows no edema, both legs are warm to touch Neurology: Awake alert, and oriented X 3, CN II-XII intact, Non focal Skin: No Rash  Data Review Lab Results  Component Value Date   HGBA1C 6.2 08/04/2022   HGBA1C 7.0  01/14/2022   HGBA1C 6.2 (A) 04/30/2021    Assessment & Plan   1. Type 2 diabetes mellitus with hyperglycemia, with long-term current use of insulin (HCC) A1c=7.2 today.  Will increase metformin and continue 70/30 at 10 units bid - Glucose (CBG) - HgB A1c - Comprehensive metabolic panel - Lipid panel - CBC with Differential/Platelet - metFORMIN (GLUCOPHAGE-XR) 500 MG 24 hr tablet; Take 2 tablets (1,000 mg total) by mouth in the morning and at bedtime.  Dispense: 120 tablet; Refill: 3  2. Hypertension associated with diabetes (HCC) - Comprehensive metabolic panel - Lipid panel - CBC with Differential/Platelet - amLODipine (NORVASC) 10 MG tablet; Take 1 tablet (10 mg total) by mouth daily.  Dispense: 30 tablet; Refill: 0  3. Hyperlipidemia associated with type 2 diabetes mellitus (HCC) - Lipid panel - CBC with Differential/Platelet  4. Dyslipidemia, goal LDL below 70 - atorvastatin (LIPITOR) 80 MG tablet; Take 1 tablet (80 mg total) by mouth daily.  Dispense: 90 tablet; Refill: 1  5. Polyarthralgia - meloxicam (MOBIC) 15 MG tablet; Take 1 tablet (15 mg total) by mouth daily.  Dispense: 30 tablet; Refill: 0 - methocarbamol (ROBAXIN) 500 MG tablet; Take 1 tablet (500 mg total) by mouth 4 (four) times daily.  Dispense: 90 tablet; Refill: 1 - Ambulatory referral to Rheumatology - TSH  6. Uncontrolled hypertension Was out-resume - valsartan (DIOVAN) 160 MG tablet; Take 1 tablet (160 mg total) by mouth once daily.  Dispense: 90 tablet; Refill: 1  7. Arthralgia of multiple joints - Ambulatory referral to Rheumatology - TSH  8. Encounter for long-term (current) use of insulin (HCC) Continue 70/30(he has RF)   Return in about 3 months (around 06/18/2023) for PCP for chronic conditions.  The patient was given clear instructions to go to ER or return to medical center if symptoms don't improve, worsen or new problems develop. The patient verbalized understanding. The patient was told  to call to get lab results if they haven't heard anything in the next week.      Georgian Co, PA-C [facility] [place], [place] [phone]   03/18/2023, 4:37 PM

## 2023-03-19 ENCOUNTER — Telehealth: Payer: Self-pay

## 2023-03-19 ENCOUNTER — Other Ambulatory Visit: Payer: Self-pay

## 2023-03-19 NOTE — Telephone Encounter (Signed)
Pt was called and vm was left, Information has been sent to nurse pool.   

## 2023-03-19 NOTE — Telephone Encounter (Signed)
-----   Message from Georgian Co sent at 03/19/2023 11:31 AM EDT ----- Your cholesterol is normal but your triglycerides are still very elevated.  Reduce sugar intake and better diabetes control should help.  Also, make sure and take atorvastatin daily.  Liver function is slightly elevated.  Avoid drinking alcohol or taking products with tylenol.  Kidney function and electrolytes are normal.  Blood count normal.  Thyroid normal.  Thanks, Georgian Co, PA-C

## 2023-03-22 ENCOUNTER — Telehealth: Payer: Self-pay

## 2023-03-22 NOTE — Telephone Encounter (Signed)
Pt was called again  and vm was left, Information has been sent to nurse pool.

## 2023-03-22 NOTE — Telephone Encounter (Signed)
-----   Message from Georgian Co sent at 03/19/2023 11:31 AM EDT ----- Your cholesterol is normal but your triglycerides are still very elevated.  Reduce sugar intake and better diabetes control should help.  Also, make sure and take atorvastatin daily.  Liver function is slightly elevated.  Avoid drinking alcohol or taking products with tylenol.  Kidney function and electrolytes are normal.  Blood count normal.  Thyroid normal.  Thanks, Georgian Co, PA-C

## 2023-04-22 ENCOUNTER — Other Ambulatory Visit: Payer: Self-pay | Admitting: Physician Assistant

## 2023-04-22 ENCOUNTER — Other Ambulatory Visit: Payer: Self-pay

## 2023-04-22 DIAGNOSIS — E1159 Type 2 diabetes mellitus with other circulatory complications: Secondary | ICD-10-CM

## 2023-04-22 MED ORDER — AMLODIPINE BESYLATE 10 MG PO TABS
10.0000 mg | ORAL_TABLET | Freq: Every day | ORAL | 1 refills | Status: DC
Start: 2023-04-22 — End: 2023-07-18
  Filled 2023-04-22: qty 90, 90d supply, fill #0
  Filled 2023-05-28 – 2023-06-23 (×3): qty 90, 90d supply, fill #1

## 2023-04-23 ENCOUNTER — Other Ambulatory Visit: Payer: Self-pay

## 2023-05-28 ENCOUNTER — Other Ambulatory Visit: Payer: Self-pay

## 2023-06-09 ENCOUNTER — Other Ambulatory Visit: Payer: Self-pay

## 2023-06-14 ENCOUNTER — Other Ambulatory Visit: Payer: Self-pay

## 2023-06-21 ENCOUNTER — Other Ambulatory Visit: Payer: Self-pay

## 2023-06-21 ENCOUNTER — Other Ambulatory Visit: Payer: Self-pay | Admitting: Nurse Practitioner

## 2023-06-21 DIAGNOSIS — E1165 Type 2 diabetes mellitus with hyperglycemia: Secondary | ICD-10-CM

## 2023-06-22 MED ORDER — HUMULIN 70/30 KWIKPEN (70-30) 100 UNIT/ML ~~LOC~~ SUPN
10.0000 [IU] | PEN_INJECTOR | Freq: Two times a day (BID) | SUBCUTANEOUS | 0 refills | Status: DC
Start: 2023-06-22 — End: 2023-07-26
  Filled 2023-06-22: qty 6, 30d supply, fill #0

## 2023-06-22 NOTE — Telephone Encounter (Signed)
Requested medication (s) are due for refill today: yes  Requested medication (s) are on the active medication list: yes  Last refill:  01/14/22  Future visit scheduled: yes  Notes to clinic:  This request has changes from the previous prescription , routing for review.     Requested Prescriptions  Pending Prescriptions Disp Refills   insulin isophane & regular human KwikPen (HUMULIN 70/30 KWIKPEN) (70-30) 100 UNIT/ML KwikPen 15 mL 11    Sig: INJECT 10 UNITS INTO THE SKIN 2 (TWO) TIMES DAILY.     Endocrinology:  Diabetes - Insulins Passed - 06/21/2023  1:28 PM      Passed - HBA1C is between 0 and 7.9 and within 180 days    HbA1c, POC (controlled diabetic range)  Date Value Ref Range Status  03/18/2023 7.2 (A) 0.0 - 7.0 % Final         Passed - Valid encounter within last 6 months    Recent Outpatient Visits           3 months ago Type 2 diabetes mellitus with hyperglycemia, with long-term current use of insulin (HCC)   [place] [facility] [place] [facility] [place]. [facility], Marylene Land M, [place]   10 months ago Controlled type 2 diabetes mellitus with neuropathy [facility])   [place] [facility] - [facility] Of [place]. [facility] Marcine Matar, MD   1 year ago Muscle spasm   [place] [facility] [place] - [facility] Of [place]. [facility] Drucilla Chalet, RPH-CPP   1 year ago Arthralgia of multiple joints   [place] [facility] - [facility] Dept Of [place]. [facility] Claiborne Rigg, NP   1 year ago Uncontrolled hypertension   [place] [facility] [place] - A Dept Of [place]. [facility] Drucilla Chalet, RPH-CPP       Future Appointments             In 4 weeks Sharon Seller, [place] [facility] [place] [facility] - A Dept Of [place]. [facility]

## 2023-06-23 ENCOUNTER — Other Ambulatory Visit: Payer: Self-pay

## 2023-06-28 ENCOUNTER — Other Ambulatory Visit: Payer: Self-pay

## 2023-07-18 ENCOUNTER — Other Ambulatory Visit: Payer: Self-pay | Admitting: Family Medicine

## 2023-07-18 DIAGNOSIS — E1159 Type 2 diabetes mellitus with other circulatory complications: Secondary | ICD-10-CM

## 2023-07-19 ENCOUNTER — Other Ambulatory Visit: Payer: Self-pay

## 2023-07-20 ENCOUNTER — Other Ambulatory Visit: Payer: Self-pay

## 2023-07-20 MED ORDER — AMLODIPINE BESYLATE 10 MG PO TABS
10.0000 mg | ORAL_TABLET | Freq: Every day | ORAL | 0 refills | Status: DC
Start: 2023-07-20 — End: 2023-10-08
  Filled 2023-07-20 – 2023-09-17 (×3): qty 30, 30d supply, fill #0

## 2023-07-21 ENCOUNTER — Ambulatory Visit: Payer: Self-pay | Attending: Nurse Practitioner | Admitting: Nurse Practitioner

## 2023-07-21 ENCOUNTER — Other Ambulatory Visit: Payer: Self-pay

## 2023-07-21 ENCOUNTER — Ambulatory Visit: Payer: Self-pay | Admitting: Physician Assistant

## 2023-07-21 ENCOUNTER — Encounter: Payer: Self-pay | Admitting: Nurse Practitioner

## 2023-07-21 VITALS — BP 144/89 | HR 78 | Ht 75.0 in | Wt 232.4 lb

## 2023-07-21 DIAGNOSIS — E1165 Type 2 diabetes mellitus with hyperglycemia: Secondary | ICD-10-CM

## 2023-07-21 DIAGNOSIS — M2550 Pain in unspecified joint: Secondary | ICD-10-CM

## 2023-07-21 DIAGNOSIS — E1159 Type 2 diabetes mellitus with other circulatory complications: Secondary | ICD-10-CM

## 2023-07-21 DIAGNOSIS — Z794 Long term (current) use of insulin: Secondary | ICD-10-CM

## 2023-07-21 DIAGNOSIS — M25471 Effusion, right ankle: Secondary | ICD-10-CM

## 2023-07-21 DIAGNOSIS — E781 Pure hyperglyceridemia: Secondary | ICD-10-CM

## 2023-07-21 DIAGNOSIS — I152 Hypertension secondary to endocrine disorders: Secondary | ICD-10-CM

## 2023-07-21 DIAGNOSIS — Z23 Encounter for immunization: Secondary | ICD-10-CM

## 2023-07-21 MED ORDER — GLUCOSE 40 % PO GEL
1.0000 | Freq: Once | ORAL | 1 refills | Status: DC | PRN
  Filled 2023-07-21: qty 37.5, fill #0

## 2023-07-21 MED ORDER — PREDNISONE 5 MG PO TABS
ORAL_TABLET | ORAL | 0 refills | Status: DC
  Filled 2023-07-21: qty 46, 21d supply, fill #0

## 2023-07-21 MED ORDER — VALSARTAN 320 MG PO TABS
320.0000 mg | ORAL_TABLET | Freq: Every day | ORAL | 1 refills | Status: DC
  Filled 2023-07-21: qty 90, 90d supply, fill #0
  Filled 2023-10-08 – 2023-10-22 (×2): qty 90, 90d supply, fill #1

## 2023-07-21 MED ORDER — MELOXICAM 15 MG PO TABS
15.0000 mg | ORAL_TABLET | Freq: Every day | ORAL | 1 refills | Status: DC
  Filled 2023-07-21: qty 30, 30d supply, fill #0
  Filled 2023-08-23 – 2023-09-17 (×2): qty 30, 30d supply, fill #1

## 2023-07-21 NOTE — Progress Notes (Addendum)
 Assessment & Plan:  Anthony Schmidt was seen today for generalized body aches.  Diagnoses and all orders for this visit:  Polyarthralgia -     DG Ankle Complete Right; Future -     predniSONE (DELTASONE) 5 MG tablet; Take 4 tablets (40 mg total) by mouth daily with breakfast for 5 days, THEN 3 tablets (30 mg total) daily with breakfast for 4 days, THEN 2 tablets (20 mg total) daily with breakfast for 4 days, THEN 1 tablet (10 mg total) daily with breakfast for 4 days, THEN 0.5 tablets (5 mg total) daily with breakfast for 4 days. -     meloxicam (MOBIC) 15 MG tablet; Take 1 tablet (15 mg total) by mouth daily.  Hypertension associated with diabetes  Diovan increased to 320 mg.  Continue amlodipine as prescribed. -     Urine Albumin/Creatinine with ratio (send out) [LAB689] -     valsartan (DIOVAN) 320 MG tablet; Take 1 tablet (320 mg total) by mouth daily. -     CMP14+EGFR Reminded to bring in blood pressure log for follow  up appointment.  RECOMMENDATIONS: DASH/Mediterranean Diets are healthier choices for HTN.    Right ankle swelling -     DG Ankle Complete Right; Future  Type 2 diabetes mellitus with hyperglycemia, with long-term current use of insulin  Taking Humulin 70/30 10 units BID and Metformin 500 mg XR BID Checks BS 3 x/day - Gives range of 117-122 Doing good with eating habits but decrease appetite in past 3 wks.  Not sure why. No increase stress on job or at home.  +aching in body Over due for eye exam, no insurance -     Hemoglobin A1c -     dextrose (GLUTOSE) 40 % GEL; Take 31 g by mouth once as needed for up to 1 dose for low blood sugar. Continue blood sugar control as discussed in office today, low carbohydrate diet, and regular physical exercise as tolerated, 150 minutes per week (30 min each day, 5 days per week, or 50 min 3 days per week). Keep blood sugar logs with fasting goal of 90-130 mg/dl, post prandial (after you eat) less than 180.  For Hypoglycemia: BS <60 and  Hyperglycemia BS >400; contact the clinic ASAP. Annual eye exams and foot exams are recommended.   Hypertriglyceridemia -     Lipid panel INSTRUCTIONS: Work on a low fat, heart healthy diet and participate in regular aerobic exercise program by working out at least 150 minutes per week; 5 days a week-30 minutes per day. Avoid red meat/beef/steak,  fried foods. junk foods, sodas, sugary drinks, unhealthy snacking, alcohol and smoking.  Drink at least 80 oz of water per day and monitor your carbohydrate intake daily.    Encounter for immunization -     Tdap vaccine greater than or equal to 7yo IM    Patient has been counseled on age-appropriate routine health concerns for screening and prevention. These are reviewed and up-to-date. Referrals have been placed accordingly. Immunizations are up-to-date or declined.    Subjective:   Chief Complaint  Patient presents with   Generalized Body Aches    Right side swelling.    Anthony Schmidt 42 y.o. male presents to office today with polyarthralgia and swelling.   Mr Colquhoun continues to endorse multiple joint arthralgias despite taking lyrica and meloxicam. RA panel normal, Lupus anticoagulant panel negative, Uric acid normal, ANA/CRP negative, ESR was elevated at 29. Associated symptoms include fatigue, myalgia, swelling in bilateral elbows  and knees per patient report.  He also notes persistent right ankle swelling ongoing for the past year.  I had him stop  atorvastatin 80mg  to ensure pain was not related to statin myalgias however pain has persisted despite not taking cholesterol medication for several weeks. Lyrica and methocarbamol caused excessive drowsiness. Gabapentin was ineffective.  He has an appointment with rheumatology in 4 weeks.    Blood pressure is elevated despite adherence with valsartan 160 mg and amlodipine 10 mg daily.  He endorses similar readings at home as well BP Readings from Last 3 Encounters:  07/21/23 (!) 144/89   03/18/23 (!) 150/94  08/04/22 (!) 150/90      Review of Systems  Constitutional:  Negative for fever, malaise/fatigue and weight loss.  HENT: Negative.  Negative for nosebleeds.   Eyes: Negative.  Negative for blurred vision, double vision and photophobia.  Respiratory: Negative.  Negative for cough and shortness of breath.   Cardiovascular:  Positive for leg swelling. Negative for chest pain and palpitations.  Gastrointestinal: Negative.  Negative for heartburn, nausea and vomiting.  Musculoskeletal:  Positive for joint pain. Negative for myalgias.  Neurological: Negative.  Negative for dizziness, focal weakness, seizures and headaches.  Psychiatric/Behavioral: Negative.  Negative for suicidal ideas.     Past Medical History:  Diagnosis Date   Diabetes mellitus, new onset (HCC) 01/2020   Hyperlipidemia    Hypertension     Past Surgical History:  Procedure Laterality Date   KNEE ARTHROSCOPY Bilateral    KNEE SURGERY Bilateral     Family History  Problem Relation Age of Onset   Diabetes Mother    Hypertension Mother    Diabetes Father    Hypertension Father     Social History Reviewed with no changes to be made today.   Outpatient Medications Prior to Visit  Medication Sig Dispense Refill   amLODipine (NORVASC) 10 MG tablet Take 1 tablet (10 mg total) by mouth daily. 30 tablet 0   atorvastatin (LIPITOR) 80 MG tablet Take 1 tablet (80 mg total) by mouth daily. 90 tablet 1   blood glucose meter kit and supplies KIT Dispense based on patient and insurance preference. Use up to four times daily as directed. (FOR ICD-9 250.00, 250.01). 1 each 0   Blood Glucose Monitoring Suppl (TRUE METRIX METER) w/Device KIT Use as instructed to check blood sugar TID. 1 kit 0   glucose blood (TRUE METRIX BLOOD GLUCOSE TEST) test strip Use as instructed to check blood sugar three times daily 100 each 2   insulin isophane & regular human KwikPen (HUMULIN 70/30 KWIKPEN) (70-30) 100 UNIT/ML  KwikPen Inject 10 Units into the skin in the morning and at bedtime. 6 mL 0   Insulin Pen Needle (TRUEPLUS 5-BEVEL PEN NEEDLES) 31G X 5 MM MISC USE 2 TIMES A DAY AS DIRECTED 100 each 6   metFORMIN (GLUCOPHAGE-XR) 500 MG 24 hr tablet Take 2 tablets (1,000 mg total) by mouth in the morning and at bedtime. 120 tablet 3   TRUEplus Lancets 28G MISC Use as instructed to check blood sugar TID. 100 each 6   valsartan (DIOVAN) 160 MG tablet Take 1 tablet (160 mg total) by mouth once daily. 90 tablet 1   acetaminophen (TYLENOL) 500 MG tablet Take 1,000-2,000 mg by mouth daily as needed for headache (pain). (Patient not taking: Reported on 07/21/2023)     dextrose (GLUTOSE) 40 % GEL Take 37.5 g by mouth once as needed for up to 1 dose for low  blood sugar. (Patient not taking: Reported on 07/21/2023) 37.5 g 1   meloxicam (MOBIC) 15 MG tablet Take 1 tablet (15 mg total) by mouth daily. (Patient not taking: Reported on 07/21/2023) 30 tablet 0   methocarbamol (ROBAXIN) 500 MG tablet Take 1 tablet (500 mg total) by mouth 4 (four) times daily. (Patient not taking: Reported on 07/21/2023) 90 tablet 1   pregabalin (LYRICA) 75 MG capsule Take 1 capsule (75 mg total) by mouth 2 (two) times daily. (Patient not taking: Reported on 07/21/2023) 60 capsule 2   No facility-administered medications prior to visit.    No Known Allergies     Objective:    BP (!) 144/89   Pulse 78   Ht 6\' 3"  (1.905 m)   Wt 232 lb 6.4 oz (105.4 kg)   SpO2 98%   BMI 29.05 kg/m  Wt Readings from Last 3 Encounters:  07/21/23 232 lb 6.4 oz (105.4 kg)  03/18/23 222 lb 12.8 oz (101.1 kg)  08/04/22 226 lb (102.5 kg)    Physical Exam Vitals and nursing note reviewed.  Constitutional:      Appearance: He is well-developed.  HENT:     Head: Normocephalic and atraumatic.  Cardiovascular:     Rate and Rhythm: Normal rate and regular rhythm.     Heart sounds: Normal heart sounds. No murmur heard.    No friction rub. No gallop.  Pulmonary:      Effort: Pulmonary effort is normal. No tachypnea or respiratory distress.     Breath sounds: Normal breath sounds. No decreased breath sounds, wheezing, rhonchi or rales.  Chest:     Chest wall: No tenderness.  Abdominal:     General: Bowel sounds are normal.     Palpations: Abdomen is soft.  Musculoskeletal:        General: Normal range of motion.     Cervical back: Normal range of motion.     Right ankle: Swelling present.  Skin:    General: Skin is warm and dry.  Neurological:     Mental Status: He is alert and oriented to person, place, and time.     Coordination: Coordination normal.  Psychiatric:        Behavior: Behavior normal. Behavior is cooperative.        Thought Content: Thought content normal.        Judgment: Judgment normal.          Patient has been counseled extensively about nutrition and exercise as well as the importance of adherence with medications and regular follow-up. The patient was given clear instructions to go to ER or return to medical center if symptoms don't improve, worsen or new problems develop. The patient verbalized understanding.   Follow-up: Return in about 3 months (around 10/19/2023).   Claiborne Rigg, FNP-BC [facility] [place], [place] [phone]   07/21/2023, 12:02 PM

## 2023-07-23 LAB — CMP14+EGFR
ALT: 41 [IU]/L (ref 0–44)
AST: 39 [IU]/L (ref 0–40)
Albumin: 5 g/dL (ref 4.1–5.1)
Alkaline Phosphatase: 91 [IU]/L (ref 44–121)
BUN/Creatinine Ratio: 10 (ref 9–20)
BUN: 10 mg/dL (ref 6–24)
Bilirubin Total: 0.5 mg/dL (ref 0.0–1.2)
CO2: 23 mmol/L (ref 20–29)
Calcium: 9.9 mg/dL (ref 8.7–10.2)
Chloride: 103 mmol/L (ref 96–106)
Creatinine, Ser: 1 mg/dL (ref 0.76–1.27)
Globulin, Total: 2.6 g/dL (ref 1.5–4.5)
Glucose: 101 mg/dL — ABNORMAL HIGH (ref 70–99)
Potassium: 4.2 mmol/L (ref 3.5–5.2)
Sodium: 142 mmol/L (ref 134–144)
Total Protein: 7.6 g/dL (ref 6.0–8.5)
eGFR: 96 mL/min/{1.73_m2} (ref >59)

## 2023-07-23 LAB — MICROALBUMIN / CREATININE URINE RATIO
Creatinine, Urine: 98.5 mg/dL
Microalb/Creat Ratio: 11 mg/g{creat} (ref 0–29)
Microalbumin, Urine: 11.1 ug/mL

## 2023-07-23 LAB — LIPID PANEL
Chol/HDL Ratio: 2.7 {ratio} (ref 0.0–5.0)
Cholesterol, Total: 112 mg/dL (ref 100–199)
HDL: 41 mg/dL (ref >39)
LDL Chol Calc (NIH): 55 mg/dL (ref 0–99)
Triglycerides: 82 mg/dL (ref 0–149)
VLDL Cholesterol Cal: 16 mg/dL (ref 5–40)

## 2023-07-23 LAB — HEMOGLOBIN A1C
Est. average glucose Bld gHb Est-mCnc: 160 mg/dL
Hgb A1c MFr Bld: 7.2 % — ABNORMAL HIGH (ref 4.8–5.6)

## 2023-07-26 ENCOUNTER — Other Ambulatory Visit: Payer: Self-pay | Admitting: Family Medicine

## 2023-07-26 ENCOUNTER — Other Ambulatory Visit: Payer: Self-pay

## 2023-07-26 DIAGNOSIS — E1165 Type 2 diabetes mellitus with hyperglycemia: Secondary | ICD-10-CM

## 2023-07-26 MED ORDER — HUMULIN 70/30 KWIKPEN (70-30) 100 UNIT/ML ~~LOC~~ SUPN
10.0000 [IU] | PEN_INJECTOR | Freq: Two times a day (BID) | SUBCUTANEOUS | 1 refills | Status: DC
  Filled 2023-07-26: qty 6, 30d supply, fill #0
  Filled 2023-08-23: qty 15, 30d supply, fill #1
  Filled 2023-08-23 – 2024-03-25 (×6): qty 6, 30d supply, fill #1

## 2023-08-09 ENCOUNTER — Encounter: Payer: Self-pay | Admitting: Nurse Practitioner

## 2023-08-23 ENCOUNTER — Other Ambulatory Visit: Payer: Self-pay | Admitting: Physician Assistant

## 2023-08-23 ENCOUNTER — Other Ambulatory Visit: Payer: Self-pay

## 2023-08-23 DIAGNOSIS — E1165 Type 2 diabetes mellitus with hyperglycemia: Secondary | ICD-10-CM

## 2023-08-23 MED ORDER — METFORMIN HCL ER 500 MG PO TB24
1000.0000 mg | ORAL_TABLET | Freq: Two times a day (BID) | ORAL | 1 refills | Status: AC
  Filled 2023-08-23 – 2023-10-22 (×4): qty 360, 90d supply, fill #0
  Filled 2024-03-25 – 2024-06-14 (×2): qty 360, 90d supply, fill #1

## 2023-08-30 NOTE — Telephone Encounter (Signed)
 Paperwork complete. Please call patient for pick up or fax if needed. Thanks

## 2023-09-01 NOTE — Telephone Encounter (Signed)
 Patient aware of results. Forms faxed and copied.

## 2023-09-02 ENCOUNTER — Other Ambulatory Visit: Payer: Self-pay

## 2023-09-06 ENCOUNTER — Ambulatory Visit: Payer: PRIVATE HEALTH INSURANCE

## 2023-09-06 ENCOUNTER — Ambulatory Visit: Payer: PRIVATE HEALTH INSURANCE | Attending: Internal Medicine | Admitting: Internal Medicine

## 2023-09-06 ENCOUNTER — Encounter: Payer: Self-pay | Admitting: Internal Medicine

## 2023-09-06 VITALS — BP 148/93 | HR 78 | Resp 14 | Ht 72.5 in | Wt 234.0 lb

## 2023-09-06 DIAGNOSIS — M542 Cervicalgia: Secondary | ICD-10-CM | POA: Diagnosis not present

## 2023-09-06 DIAGNOSIS — M25512 Pain in left shoulder: Secondary | ICD-10-CM

## 2023-09-06 DIAGNOSIS — G8929 Other chronic pain: Secondary | ICD-10-CM | POA: Diagnosis not present

## 2023-09-06 DIAGNOSIS — M25571 Pain in right ankle and joints of right foot: Secondary | ICD-10-CM

## 2023-09-06 DIAGNOSIS — M25511 Pain in right shoulder: Secondary | ICD-10-CM

## 2023-09-06 DIAGNOSIS — M1380 Other specified arthritis, unspecified site: Secondary | ICD-10-CM | POA: Diagnosis not present

## 2023-09-06 NOTE — Patient Instructions (Signed)
I am testing for several causes of inflammatory joint pain if positive we can discuss specific mediations for these. I am checking xrays for any evidence of inflammation damage but also to see what extent of wear and tear is in those areas.

## 2023-09-06 NOTE — Progress Notes (Signed)
Office Visit Note  Patient: Anthony Schmidt             Date of Birth: 1981-03-24           MRN: [id number]             PCP: Claiborne Rigg, NP Referring: Anders Simmonds, PA-C Visit Date: 09/06/2023 Occupation: Driver  Subjective:  New Patient (Initial Visit) (Patient states he has joint pain in his ankles, hands, elbows, shoulders, neck, and big toes. Patient states his pain moves around. Patient states he stays still constantly. Patient states he is constantly in pain. )  Discussed the use of AI scribe software for clinical note transcription with the patient, who gave verbal consent to proceed.  History of Present Illness   Anthony Schmidt is a 43 y.o. male with a history of diabetes and neuropathy, presents with a chief complaint of chronic, widespread joint pain and stiffness, which has been ongoing for over a year and a half. The pain is described as a constant 7 out of 10, with occasional increases in intensity. The patient reports that the pain is not localized to one specific area, but rather, it moves around to different joints, including the ankles, neck, and shoulders. The patient describes the pain as a burning sensation, similar to the feeling after a strenuous workout.  In addition to the pain, the patient experiences intermittent joint swelling, which also moves around to different joints. The swelling in each joint typically lasts for a few days to a week before subsiding. The patient denies any specific triggers for the swelling and notes that it occurs even on days when they are not physically active.  The patient has been managing the pain with over-the-counter Tylenol and occasionally uses [facility] patches, but reports that these treatments provide minimal relief. The patient is also on a daily regimen of Meloxicam, an anti-inflammatory medication, and Lyrica for neuropathy.  The patient denies any recent injuries, infections, or significant changes in lifestyle that  could have triggered these symptoms. The patient also denies any family history of inflammatory or autoimmune conditions. The patient has not noticed any associated rashes, vision changes, or severe bowel problems.  The patient has a history of knee surgery, which was performed many years ago due to injuries sustained while playing football. The patient denies any recent issues with the knees. The patient also reports dry eyes, which they attribute to working in a [employer].  The patient's symptoms have been persistent and have not improved with current treatments, leading to significant frustration and a desire for a better understanding of the underlying cause.    02/2022 ANA neg RF neg CCP neg  Activities of Daily Living:  Patient reports morning stiffness for 24 hours.   Patient Reports nocturnal pain.  Difficulty dressing/grooming: Denies Difficulty climbing stairs: Denies Difficulty getting out of chair: Denies Difficulty using hands for taps, buttons, cutlery, and/or writing: Reports  Review of Systems  Constitutional:  Positive for fatigue.  HENT:  Negative for mouth sores and mouth dryness.   Eyes:  Negative for dryness.  Respiratory:  Negative for shortness of breath.   Cardiovascular:  Negative for chest pain and palpitations.  Gastrointestinal:  Negative for blood in stool, constipation and diarrhea.  Endocrine: Negative for increased urination.  Genitourinary:  Negative for involuntary urination.  Musculoskeletal:  Positive for joint pain, gait problem, joint pain, joint swelling, myalgias, muscle weakness, morning stiffness, muscle tenderness and myalgias.  Skin:  Negative  for color change, rash, hair loss and sensitivity to sunlight.  Allergic/Immunologic: Negative for susceptible to infections.  Neurological:  Positive for dizziness and headaches.  Hematological:  Negative for swollen glands.  Psychiatric/Behavioral:  Positive for sleep disturbance. Negative for  depressed mood. The patient is nervous/anxious.     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   Type 2 diabetes mellitus with hyperglycemia, with long-term current use of insulin (HCC) 08/04/2022   Syncope and collapse 04/07/2020   Alcohol intoxication with delirium (HCC)    DKA (diabetic ketoacidosis) (HCC) 01/17/2020   Hyperkalemia 01/17/2020    Past Medical History:  Diagnosis Date   Diabetes mellitus, new onset (HCC) 01/2020   Hyperlipidemia    Hypertension     Family History  Problem Relation Age of Onset   Diabetes Mother    Hypertension Mother    Diabetes Father    Hypertension Father    Past Surgical History:  Procedure Laterality Date   KNEE ARTHROSCOPY Bilateral    KNEE SURGERY Bilateral    Social History   Social History Narrative   Not on file   Immunization History  Administered Date(s) Administered   Tdap 07/21/2023     Objective: Vital Signs: BP (!) 148/93 (BP Location: Right Arm, Patient Position: Sitting, Cuff Size: Large)   Pulse 78   Resp 14   Ht 6' 0.5" (1.842 m)   Wt 234 lb (106.1 kg)   BMI 31.30 kg/m    Physical Exam HENT:     Mouth/Throat:     Mouth: Mucous membranes are moist.     Pharynx: Oropharynx is clear.  Eyes:     Conjunctiva/sclera: Conjunctivae normal.  Cardiovascular:     Rate and Rhythm: Normal rate and regular rhythm.  Pulmonary:     Effort: Pulmonary effort is normal.     Breath sounds: Normal breath sounds.  Musculoskeletal:     Comments: Trace pitting edema b/l lower legs  Lymphadenopathy:     Cervical: No cervical adenopathy.  Skin:    General: Skin is warm and dry.     Findings: No rash.  Neurological:     Mental Status: He is alert.  Psychiatric:        Mood and Affect: Mood normal.      Musculoskeletal Exam:  Neck full ROM, tenderness to pressure paraspinal muscles upper neck and base of occiput Shoulders full ROM tenderness to pressure  on anterior lateral sides, no palpable effusion Elbows full ROM tenderness to pressure on lateral epicondyle, no swelling Wrists full ROM no tenderness or swelling Fingers full ROM no tenderness or swelling No paraspinal tenderness to palpation over upper and lower back Knees full ROM, no effusions Ankle dorsiflexion slightly poor, right achilles tenderness to pressure and with full movement, no nodules Mild tenderness to pressure on plantar side proximal to 1st MTP and distal to calcaneus   Investigation: No additional findings.  Imaging: XR Cervical Spine 2 or 3 views Result Date: 09/06/2023 X-ray cervical spine 2 views AP and lateral Multilevel degenerative changes mild except at C5-C6 with disc height loss and large anterior endplate osteophytes.  There is mild spondylolisthesis with loss of the normal cervical lordosis.  XR Shoulder Left Result Date: 09/06/2023 X-ray left shoulder 4 views Normal internal/external rotation humerus position.  Glenohumeral joint space appears normal.  Acromiohumeral distance is normal.  AC joint appears normal.  No significant appearing bony abnormality.  No joint effusion  or abnormal calcifications seen.  XR Shoulder Right Result Date: 09/06/2023 X-ray right shoulder 4 views Normal internal and external rotation humerus position.  Glenohumeral joint space appears normal.  Acromiohumeral distance is normal.  AC joint appears normal. No significant appearing degenerative changes . No definite effusions or abnormal calcifications seen.  XR Ankle 2 Views Right Result Date: 09/06/2023 X-ray right ankle 2 views Tibiotalar joint space alignment appears normal.  There are osteophytes on anterior border and on dorsal midfoot joints.  Large posterior osteophyte.  There are posterior and plantar calcaneal enthesophytes.  No visible erosions or abnormal calcification.  No acute appearing bony abnormality or obvious soft tissue abnormality.   Recent Labs: Lab  Results  Component Value Date   WBC 5.9 03/18/2023   HGB 13.3 03/18/2023   PLT 196 03/18/2023   NA 142 07/21/2023   K 4.2 07/21/2023   CL 103 07/21/2023   CO2 23 07/21/2023   GLUCOSE 101 (H) 07/21/2023   BUN 10 07/21/2023   CREATININE 1.00 07/21/2023   BILITOT 0.5 07/21/2023   ALKPHOS 91 07/21/2023   AST 39 07/21/2023   ALT 41 07/21/2023   PROT 7.6 07/21/2023   ALBUMIN 5.0 07/21/2023   CALCIUM 9.9 07/21/2023   GFRAA >60 04/08/2020    Speciality Comments: No specialty comments available.  Procedures:  No procedures performed Allergies: Patient has no known allergies.   Assessment / Plan:     Visit Diagnoses: Migratory polyarthritis - Plan: Chlamydia/Neisseria Gonorrhoeae RNA,TMA,Urogenital, Uric acid, Sedimentation rate, C-reactive protein, B. burgdorfi antibodies Chronic, intermittent joint pain and swelling in multiple joints for over a year and a half. Pain is widespread and constant, rated 7/10. Swelling migrates from one joint to another, lasting a few days at a time. Noted redness in some joints when swollen. History of knee replacement surgery in 1996. Currently on Meloxicam and Lyrica for pain management.  Ordered joint x-rays to assess for calcifications, erosions, or other signs of inflammatory damage. Neck with C5-C6 worst DDD, heels with enthesophytes but ankle joint intact, shoulders look good so probably rotator cuff arthropathy or referring from neck.  -Recheck sedimentation rate and other inflammatory markers in blood work concern is for migratory inflammatory arthritis -Consider increasing dose of Lyrica if labs and imaging do not reveal a specific cause for joint inflammation.  Peripheral Edema Noted in ankles, likely secondary to Amlodipine use for hypertension. No signs of kidney or heart disease contributing to edema. -Continue Amlodipine as edema is not severe.  Dry Eyes Noted during examination, likely due to environmental exposure at work. -No  specific intervention needed at this time.  Neuropathy Noted in medical history, patient is not currently on medication for this. -Consider reinitiating medication if joint pain and swelling is not better managed after adjustments to current treatment plan.  Follow-up Pending results of labs and imaging, discuss potential targeted treatments for identified cause of joint inflammation. If no specific cause is found or allears more secondary to structural/degenerative disease, consider referral to a more appropriate specialist for management of osteoarthritis or neuropathy symptoms.     Orders: Orders Placed This Encounter  Procedures   Chlamydia/Neisseria Gonorrhoeae RNA,TMA,Urogenital   XR Ankle 2 Views Right   XR Shoulder Left   XR Shoulder Right   XR Cervical Spine 2 or 3 views   Uric acid   Sedimentation rate   C-reactive protein   B. burgdorfi antibodies   No orders of the defined types were placed in this encounter.  Follow-Up Instructions: Return in about 4 weeks (around 10/04/2023) for New pt OA/?arthritis f/u 69mo.   Fuller Plan, MD  Note - This record has been created using [employer].  Chart creation errors have been sought, but may not always  have been located. Such creation errors do not reflect on  the standard of medical care.

## 2023-09-09 LAB — C-REACTIVE PROTEIN: CRP: <3 mg/L (ref <8.0)

## 2023-09-09 LAB — CHLAMYDIA/NEISSERIA GONORRHOEAE RNA,TMA,UROGENTIAL
C. trachomatis RNA, TMA: NOT DETECTED
N. gonorrhoeae RNA, TMA: NOT DETECTED

## 2023-09-09 LAB — B. BURGDORFI ANTIBODIES: B burgdorferi Ab IgG+IgM: <0.9 {index}

## 2023-09-09 LAB — URIC ACID: Uric Acid, Serum: 7.3 mg/dL (ref 4.0–8.0)

## 2023-09-09 LAB — SEDIMENTATION RATE: Sed Rate: 14 mm/h (ref 0–15)

## 2023-09-17 ENCOUNTER — Other Ambulatory Visit: Payer: Self-pay

## 2023-09-20 ENCOUNTER — Other Ambulatory Visit: Payer: Self-pay

## 2023-09-29 ENCOUNTER — Other Ambulatory Visit: Payer: Self-pay

## 2023-10-05 ENCOUNTER — Telehealth: Payer: Self-pay

## 2023-10-05 NOTE — Telephone Encounter (Signed)
 Copied from CRM [id number]. Topic: General - Other >> Oct 05, 2023  4:19 PM Eunice Blase wrote: Reason for CRM: Received call from [facility] per [facility] ph: [phone] , fax: [phone] regarding [employer] request was faxed on 09/24/2023. Please call or fax.

## 2023-10-08 ENCOUNTER — Other Ambulatory Visit: Payer: Self-pay

## 2023-10-08 ENCOUNTER — Other Ambulatory Visit: Payer: Self-pay | Admitting: Family Medicine

## 2023-10-08 DIAGNOSIS — E1159 Type 2 diabetes mellitus with other circulatory complications: Secondary | ICD-10-CM

## 2023-10-08 MED ORDER — AMLODIPINE BESYLATE 10 MG PO TABS
10.0000 mg | ORAL_TABLET | Freq: Every day | ORAL | 0 refills | Status: DC
  Filled 2023-10-08 – 2023-10-22 (×2): qty 30, 30d supply, fill #0

## 2023-10-10 ENCOUNTER — Other Ambulatory Visit: Payer: Self-pay | Admitting: Physician Assistant

## 2023-10-10 ENCOUNTER — Other Ambulatory Visit: Payer: Self-pay | Admitting: Nurse Practitioner

## 2023-10-10 DIAGNOSIS — E785 Hyperlipidemia, unspecified: Secondary | ICD-10-CM

## 2023-10-10 DIAGNOSIS — M2550 Pain in unspecified joint: Secondary | ICD-10-CM

## 2023-10-11 ENCOUNTER — Other Ambulatory Visit (HOSPITAL_COMMUNITY): Payer: Self-pay

## 2023-10-11 NOTE — Telephone Encounter (Signed)
Needs to follow up with rheumatology.

## 2023-10-12 ENCOUNTER — Other Ambulatory Visit: Payer: Self-pay

## 2023-10-12 ENCOUNTER — Other Ambulatory Visit (HOSPITAL_COMMUNITY): Payer: Self-pay

## 2023-10-12 MED ORDER — ATORVASTATIN CALCIUM 80 MG PO TABS
80.0000 mg | ORAL_TABLET | Freq: Every day | ORAL | 1 refills | Status: DC
  Filled 2023-10-12 – 2023-10-27 (×4): qty 90, 90d supply, fill #0
  Filled 2024-03-25: qty 90, 90d supply, fill #1

## 2023-10-19 ENCOUNTER — Other Ambulatory Visit: Payer: Self-pay

## 2023-10-21 ENCOUNTER — Other Ambulatory Visit (HOSPITAL_COMMUNITY): Payer: Self-pay

## 2023-10-22 ENCOUNTER — Ambulatory Visit: Payer: Self-pay | Admitting: Nurse Practitioner

## 2023-10-22 ENCOUNTER — Other Ambulatory Visit: Payer: Self-pay

## 2023-10-22 ENCOUNTER — Other Ambulatory Visit: Payer: Self-pay | Admitting: Nurse Practitioner

## 2023-10-22 DIAGNOSIS — M2550 Pain in unspecified joint: Secondary | ICD-10-CM

## 2023-10-22 MED ORDER — MELOXICAM 15 MG PO TABS
15.0000 mg | ORAL_TABLET | Freq: Every day | ORAL | 1 refills | Status: DC
  Filled 2023-10-22: qty 30, 30d supply, fill #0

## 2023-10-25 ENCOUNTER — Other Ambulatory Visit: Payer: Self-pay

## 2023-10-25 ENCOUNTER — Other Ambulatory Visit (HOSPITAL_COMMUNITY): Payer: Self-pay

## 2023-10-26 ENCOUNTER — Other Ambulatory Visit (HOSPITAL_COMMUNITY): Payer: Self-pay

## 2023-10-26 NOTE — Progress Notes (Signed)
 Office Visit Note  Patient: Anthony Schmidt             Date of Birth: 18-Jul-1981           MRN: [id number]             PCP: Claiborne Rigg, NP Referring: Claiborne Rigg, NP Visit Date: 10/27/2023   Subjective:  Follow-up   Discussed the use of AI scribe software for clinical note transcription with the patient, who gave verbal consent to proceed.  History of Present Illness   Anthony Schmidt is a 43 y.o. male here for follow up for continued joint pain and swelling. Workup at our initial visit was negative for sed rate, gout, or Lyme. He had degenerative changes in the cervical spine and in his foot and ankle.  He has experienced swelling in both elbows and the right ankle since the last visit in January. The swelling lasts about a week and is managed with ice and topical patches, but the soreness persists. He experiences persistent pain in the elbows, particularly around the olecranon and epicondyle areas, which is exacerbated by certain movements. He has stopped lifting weights to see if it would alleviate the pain.  He is not taking any oral anti-inflammatory drugs regularly, as he was advised against ibuprofen due to potential interactions with other medications. He uses Tylenol occasionally and is currently taking meloxicam but has not noticed any significant improvement in symptoms. He has not tried diclofenac.  He has pain in the right foot, particularly around the heel and ankle, which is consistent with plantar fasciitis or Achilles tendonitis. The pain is persistent and affects his daily activities.  He experiences hip pain when lying down, which affects his sleep. He does not currently take any medication for sleep but reports that the pain is the main factor keeping him awake.  He has a rash that appeared on his neck, which turned red, dried out, and then turned white. He applied Vaseline to the area.        Previous HPI 09/06/23 Anthony Schmidt is a 43 y.o. male with a  history of diabetes and neuropathy, presents with a chief complaint of chronic, widespread joint pain and stiffness, which has been ongoing for over a year and a half. The pain is described as a constant 7 out of 10, with occasional increases in intensity. The patient reports that the pain is not localized to one specific area, but rather, it moves around to different joints, including the ankles, neck, and shoulders. The patient describes the pain as a burning sensation, similar to the feeling after a strenuous workout.   In addition to the pain, the patient experiences intermittent joint swelling, which also moves around to different joints. The swelling in each joint typically lasts for a few days to a week before subsiding. The patient denies any specific triggers for the swelling and notes that it occurs even on days when they are not physically active.   The patient has been managing the pain with over-the-counter Tylenol and occasionally uses [facility] patches, but reports that these treatments provide minimal relief. The patient is also on a daily regimen of Meloxicam, an anti-inflammatory medication, and Lyrica for neuropathy.   The patient denies any recent injuries, infections, or significant changes in lifestyle that could have triggered these symptoms. The patient also denies any family history of inflammatory or autoimmune conditions. The patient has not noticed any associated rashes, vision changes, or severe bowel problems.  The patient has a history of knee surgery, which was performed many years ago due to injuries sustained while playing football. The patient denies any recent issues with the knees. The patient also reports dry eyes, which they attribute to working in a [employer].   The patient's symptoms have been persistent and have not improved with current treatments, leading to significant frustration and a desire for a better understanding of the underlying cause.       02/2022 ANA neg RF neg CCP neg   Review of Systems  Constitutional:  Positive for fatigue.  HENT:  Negative for mouth sores and mouth dryness.   Eyes:  Positive for dryness.  Respiratory:  Negative for shortness of breath.   Cardiovascular:  Negative for chest pain and palpitations.  Gastrointestinal:  Positive for constipation and diarrhea. Negative for blood in stool.  Endocrine: Negative for increased urination.  Genitourinary:  Negative for involuntary urination.  Musculoskeletal:  Positive for joint pain, gait problem, joint pain, joint swelling, myalgias, muscle weakness, morning stiffness, muscle tenderness and myalgias.  Skin:  Positive for rash. Negative for color change, hair loss and sensitivity to sunlight.  Allergic/Immunologic: Negative for susceptible to infections.  Neurological:  Negative for dizziness and headaches.  Hematological:  Negative for swollen glands.  Psychiatric/Behavioral:  Positive for sleep disturbance. Negative for depressed mood. The patient is not nervous/anxious.     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   Type 2 diabetes mellitus with hyperglycemia, with long-term current use of insulin (HCC) 08/04/2022   Syncope and collapse 04/07/2020   Alcohol intoxication with delirium (HCC)    DKA (diabetic ketoacidosis) (HCC) 01/17/2020   Hyperkalemia 01/17/2020    Past Medical History:  Diagnosis Date   Diabetes mellitus, new onset (HCC) 01/2020   Hyperlipidemia    Hypertension     Family History  Problem Relation Age of Onset   Diabetes Mother    Hypertension Mother    Diabetes Father    Hypertension Father    Past Surgical History:  Procedure Laterality Date   KNEE ARTHROSCOPY Bilateral    KNEE SURGERY Bilateral    Social History   Social History Narrative   Not on file   Immunization History  Administered Date(s) Administered   Tdap 07/21/2023      Objective: Vital Signs: BP (!) 148/96 (BP Location: Left Arm, Patient Position: Sitting, Cuff Size: Large)   Pulse 81   Resp 14   Ht 6' 0.5" (1.842 m)   Wt 226 lb (102.5 kg)   BMI 30.23 kg/m    Physical Exam Eyes:     Conjunctiva/sclera: Conjunctivae normal.  Cardiovascular:     Rate and Rhythm: Normal rate and regular rhythm.  Pulmonary:     Effort: Pulmonary effort is normal.     Breath sounds: Normal breath sounds.  Musculoskeletal:     Right lower leg: No edema.     Left lower leg: No edema.  Lymphadenopathy:     Cervical: No cervical adenopathy.  Skin:    General: Skin is warm and dry.     Findings: No rash.  Neurological:     Mental Status: He is alert.  Psychiatric:        Mood and Affect: Mood normal.      Musculoskeletal Exam:  Neck full ROM, tenderness to pressure paraspinal muscles upper neck and base of occiput  Shoulders full ROM no tenderness or swelling Elbows  with full range of motion, pain on resisted wrist movement Wrists full ROM no tenderness or swelling Fingers full ROM no tenderness or swelling Hips tender when lying down, no pain on direct pressure Knees full ROM no tenderness or swelling Right achilles tenderness to pressure and with full movement, no nodules     Investigation: No additional findings.  Imaging: No results found.  Recent Labs: Lab Results  Component Value Date   WBC 5.9 03/18/2023   HGB 13.3 03/18/2023   PLT 196 03/18/2023   NA 142 07/21/2023   K 4.2 07/21/2023   CL 103 07/21/2023   CO2 23 07/21/2023   GLUCOSE 101 (H) 07/21/2023   BUN 10 07/21/2023   CREATININE 1.00 07/21/2023   BILITOT 0.5 07/21/2023   ALKPHOS 91 07/21/2023   AST 39 07/21/2023   ALT 41 07/21/2023   PROT 7.6 07/21/2023   ALBUMIN 5.0 07/21/2023   CALCIUM 9.9 07/21/2023   GFRAA >60 04/08/2020    Speciality Comments: No specialty comments available.  Procedures:  No procedures performed Allergies: Patient has no known allergies.    Assessment / Plan:     Visit Diagnoses: Migratory polyarthritis - Plan: diclofenac (VOLTAREN) 75 MG EC tablet Chronic pain of both shoulders Joint pain and swelling Intermittent swelling and soreness in elbows and right ankle. Meloxicam ineffective. Differential includes tendonitis or soft tissue issues. X-rays show degenerative changes in neck and foot. Possible rotator cuff issue in shoulders. Consider stronger anti-inflammatory medication. - Prescribed diclofenac to replace meloxicam. Monitor for stomach irritation and kidney function. - If symptoms not improving significantly would refer for ortho/sports medicine specialist for further evaluation and potential imaging or injections.  Bilateral hip pain - Plan: cyclobenzaprine (FLEXERIL) 10 MG tablet Pain in hips when lying down, possibly related to bursitis or tendon issues. Pain located in tendons and bursa, not joint. Muscle relaxers can alleviate pain and aid sleep. - Prescribed muscle relaxer (e.g., Flexeril) at night to aid sleep and alleviate hip pain. Monitor for drowsiness and adjust dose if necessary.  Degenerative disc disease Degenerative changes at C5-C6 with bone spurring. Loss of normal lordosis curvature. No impingement of spine or nerves. Symptoms may contribute to neck pain. - Consider physical therapy or chiropractic care to address loss of curvature and muscle alignment.     Orders: No orders of the defined types were placed in this encounter.  Meds ordered this encounter  Medications   diclofenac (VOLTAREN) 75 MG EC tablet    Sig: Take 1 tablet (75 mg total) by mouth 2 (two) times daily as needed.    Dispense:  60 tablet    Refill:  2   cyclobenzaprine (FLEXERIL) 10 MG tablet    Sig: Take 1 tablet (10 mg total) by mouth at bedtime as needed.    Dispense:  30 tablet    Refill:  2     Follow-Up Instructions: Return in about 3 months (around 01/27/2024) for Pain/?OA/?nerve NSAID/MR f/u 3mos.   Fuller Plan, MD  Note - This record has been created using [employer].  Chart creation errors have been sought, but may not always  have been located. Such creation errors do not reflect on  the standard of medical care.

## 2023-10-27 ENCOUNTER — Ambulatory Visit: Payer: PRIVATE HEALTH INSURANCE | Attending: Internal Medicine | Admitting: Internal Medicine

## 2023-10-27 ENCOUNTER — Other Ambulatory Visit (HOSPITAL_COMMUNITY): Payer: Self-pay

## 2023-10-27 ENCOUNTER — Encounter: Payer: Self-pay | Admitting: Internal Medicine

## 2023-10-27 ENCOUNTER — Other Ambulatory Visit: Payer: Self-pay

## 2023-10-27 VITALS — BP 148/96 | HR 81 | Resp 14 | Ht 72.5 in | Wt 226.0 lb

## 2023-10-27 DIAGNOSIS — G8929 Other chronic pain: Secondary | ICD-10-CM | POA: Diagnosis not present

## 2023-10-27 DIAGNOSIS — M1380 Other specified arthritis, unspecified site: Secondary | ICD-10-CM

## 2023-10-27 DIAGNOSIS — M25512 Pain in left shoulder: Secondary | ICD-10-CM

## 2023-10-27 DIAGNOSIS — M25551 Pain in right hip: Secondary | ICD-10-CM

## 2023-10-27 DIAGNOSIS — M25511 Pain in right shoulder: Secondary | ICD-10-CM

## 2023-10-27 DIAGNOSIS — M25552 Pain in left hip: Secondary | ICD-10-CM

## 2023-10-27 MED ORDER — CYCLOBENZAPRINE HCL 10 MG PO TABS
10.0000 mg | ORAL_TABLET | Freq: Every evening | ORAL | 2 refills | Status: AC | PRN
  Filled 2023-10-27: qty 30, 30d supply, fill #0
  Filled 2024-03-25 – 2024-06-19 (×2): qty 30, 30d supply, fill #1

## 2023-10-27 MED ORDER — DICLOFENAC SODIUM 75 MG PO TBEC
75.0000 mg | DELAYED_RELEASE_TABLET | Freq: Two times a day (BID) | ORAL | 2 refills | Status: DC | PRN
  Filled 2023-10-27: qty 60, 30d supply, fill #0
  Filled 2023-12-15: qty 60, 30d supply, fill #1
  Filled 2024-01-26 – 2024-03-25 (×3): qty 60, 30d supply, fill #2

## 2023-10-28 ENCOUNTER — Other Ambulatory Visit: Payer: Self-pay

## 2023-10-29 ENCOUNTER — Other Ambulatory Visit: Payer: Self-pay

## 2023-11-05 ENCOUNTER — Other Ambulatory Visit (HOSPITAL_COMMUNITY): Payer: Self-pay

## 2023-11-08 ENCOUNTER — Telehealth: Payer: Self-pay

## 2023-11-08 NOTE — Telephone Encounter (Unsigned)
 Copied from CRM [id number]. Topic: General - Other >> Nov 08, 2023  9:53 AM Franchot Heidelberg wrote: Reason for CRM: Morrie Sheldon from [employer] called regarding a certificate of med necessity and a note for the patient's insulin pump. She says this was faxed last week. Please advise  Best contact: [phone] ext. [phone]

## 2023-11-16 ENCOUNTER — Other Ambulatory Visit: Payer: Self-pay

## 2023-11-16 ENCOUNTER — Ambulatory Visit: Payer: PRIVATE HEALTH INSURANCE | Attending: Nurse Practitioner | Admitting: Nurse Practitioner

## 2023-11-16 ENCOUNTER — Telehealth: Payer: Self-pay

## 2023-11-16 ENCOUNTER — Encounter: Payer: Self-pay | Admitting: Nurse Practitioner

## 2023-11-16 VITALS — BP 151/89 | HR 72 | Resp 19 | Ht 72.5 in | Wt 227.0 lb

## 2023-11-16 DIAGNOSIS — E1159 Type 2 diabetes mellitus with other circulatory complications: Secondary | ICD-10-CM

## 2023-11-16 DIAGNOSIS — I152 Hypertension secondary to endocrine disorders: Secondary | ICD-10-CM

## 2023-11-16 DIAGNOSIS — Z794 Long term (current) use of insulin: Secondary | ICD-10-CM | POA: Diagnosis not present

## 2023-11-16 DIAGNOSIS — M1380 Other specified arthritis, unspecified site: Secondary | ICD-10-CM

## 2023-11-16 DIAGNOSIS — E1165 Type 2 diabetes mellitus with hyperglycemia: Secondary | ICD-10-CM

## 2023-11-16 LAB — POCT GLYCOSYLATED HEMOGLOBIN (HGB A1C): Hemoglobin A1C: 7 % — AB (ref 4.0–5.6)

## 2023-11-16 MED ORDER — ACETAMINOPHEN 500 MG PO TABS
500.0000 mg | ORAL_TABLET | Freq: Three times a day (TID) | ORAL | 1 refills | Status: DC | PRN
  Filled 2023-11-16: qty 60, 10d supply, fill #0
  Filled 2023-12-09: qty 60, 10d supply, fill #1

## 2023-11-16 MED ORDER — AMLODIPINE BESYLATE 10 MG PO TABS
10.0000 mg | ORAL_TABLET | Freq: Every day | ORAL | 1 refills | Status: DC
  Filled 2023-11-16 – 2023-12-09 (×2): qty 90, 90d supply, fill #0
  Filled 2024-03-25: qty 90, 90d supply, fill #1

## 2023-11-16 MED ORDER — VALSARTAN 320 MG PO TABS
320.0000 mg | ORAL_TABLET | Freq: Every day | ORAL | 1 refills | Status: DC
  Filled 2023-11-16: qty 90, 90d supply, fill #0

## 2023-11-16 MED ORDER — VALSARTAN-HYDROCHLOROTHIAZIDE 320-25 MG PO TABS
1.0000 | ORAL_TABLET | Freq: Every day | ORAL | 3 refills | Status: DC
  Filled 2023-11-16: qty 90, 90d supply, fill #0
  Filled 2024-03-25: qty 90, 90d supply, fill #1

## 2023-11-16 NOTE — Telephone Encounter (Signed)
 Copied from CRM [id number]. Topic: General - Other >> Nov 16, 2023  2:27 PM Marland Kitchen D wrote: Says they sent over forms on 10/27/2023 [employer] [place] Call back [phone] ext [phone]

## 2023-11-16 NOTE — Progress Notes (Signed)
 Assessment & Plan:  Anthony Schmidt was seen today for medical management of chronic issues.  Diagnoses and all orders for this visit:  Type 2 diabetes mellitus with hyperglycemia, with long-term current use of insulin  Continue all medications as prescribed. He still has not arranged for his CGM to be delivered -     POCT glycosylated hemoglobin (Hb A1C) -     CMP14+EGFR Continue blood sugar control as discussed in office today, low carbohydrate diet, and regular physical exercise as tolerated, 150 minutes per week (30 min each day, 5 days per week, or 50 min 3 days per week). Keep blood sugar logs with fasting goal of 90-130 mg/dl, post prandial (after you eat) less than 180.  For Hypoglycemia: BS <60 and Hyperglycemia BS >400; contact the clinic ASAP. Annual eye exams and foot exams are recommended.   Hypertension associated with diabetes  Switch valsartan  to diovan  HCT -     amLODipine  (NORVASC ) 10 MG tablet; Take 1 tablet (10 mg total) by mouth daily. -     Discontinue: valsartan  (DIOVAN ) 320 MG tablet; Take 1 tablet (320 mg total) by mouth daily. -     valsartan -hydrochlorothiazide  (DIOVAN -HCT) 320-25 MG tablet; Take 1 tablet by mouth daily. -     CMP14+EGFR  Migratory polyarthritis -     acetaminophen  (TYLENOL ) 500 MG tablet; Take 1-2 tablets (500-1,000 mg total) by mouth every 8 (eight) hours as needed for headache (pain).    Patient has been counseled on age-appropriate routine health concerns for screening and prevention. These are reviewed and up-to-date. Referrals have been placed accordingly. Immunizations are up-to-date or declined.    Subjective:   Chief Complaint  Patient presents with   Medical Management of Chronic Issues    Anthony Schmidt 43 y.o. male presents to office today for follow up to HTN and DM.  He has a past medical history of  migratory polyarthritis, DM2 (01/2020), Hyperlipidemia, and Hypertension.    DM 2 A1c improved and down from 7.2 to 7.0. He is  currently prescribed humalin 70/30 and metformin  1000 mg BID.  Lab Results  Component Value Date   HGBA1C 7.0 (A) 11/16/2023    Lab Results  Component Value Date   HGBA1C 7.2 (H) 07/21/2023     HTN Blood pressure is not well controlled. He is currently prescribed valsartan  320 mg daily and amlodipine  10 mg daily.  BP Readings from Last 3 Encounters:  11/16/23 (!) 151/89  10/27/23 (!) 148/96  09/06/23 (!) 148/93    Review of Systems  Constitutional:  Negative for fever, malaise/fatigue and weight loss.  HENT: Negative.  Negative for nosebleeds.   Eyes: Negative.  Negative for blurred vision, double vision and photophobia.  Respiratory: Negative.  Negative for cough and shortness of breath.   Cardiovascular: Negative.  Negative for chest pain, palpitations and leg swelling.  Gastrointestinal: Negative.  Negative for heartburn, nausea and vomiting.  Musculoskeletal:  Positive for joint pain. Negative for myalgias.  Neurological: Negative.  Negative for dizziness, focal weakness, seizures and headaches.  Psychiatric/Behavioral: Negative.  Negative for suicidal ideas.     Past Medical History:  Diagnosis Date   Diabetes mellitus, new onset (HCC) 01/2020   Hyperlipidemia    Hypertension     Past Surgical History:  Procedure Laterality Date   KNEE ARTHROSCOPY Bilateral    KNEE SURGERY Bilateral     Family History  Problem Relation Age of Onset   Diabetes Mother    Hypertension Mother    Diabetes  Father    Hypertension Father     Social History Reviewed with no changes to be made today.   Outpatient Medications Prior to Visit  Medication Sig Dispense Refill   atorvastatin  (LIPITOR) 80 MG tablet Take 1 tablet (80 mg total) by mouth daily. 90 tablet 1   blood glucose meter kit and supplies KIT Dispense based on patient and insurance preference. Use up to four times daily as directed. (FOR ICD-9 250.00, 250.01). 1 each 0   Blood Glucose Monitoring Suppl (TRUE METRIX  METER) w/Device KIT Use as instructed to check blood sugar TID. 1 kit 0   cyclobenzaprine  (FLEXERIL ) 10 MG tablet Take 1 tablet (10 mg total) by mouth at bedtime as needed. 30 tablet 2   diclofenac  (VOLTAREN ) 75 MG EC tablet Take 1 tablet (75 mg total) by mouth 2 (two) times daily as needed. 60 tablet 2   glucose blood (TRUE METRIX BLOOD GLUCOSE TEST) test strip Use as instructed to check blood sugar three times daily 100 each 2   insulin  isophane & regular human KwikPen (HUMULIN  70/30 KWIKPEN) (70-30) 100 UNIT/ML KwikPen Inject 10 Units into the skin in the morning and at bedtime. 18 mL 1   Insulin  Pen Needle (TRUEPLUS 5-BEVEL PEN NEEDLES) 31G X 5 MM MISC USE 2 TIMES A DAY AS DIRECTED 100 each 6   metFORMIN  (GLUCOPHAGE -XR) 500 MG 24 hr tablet Take 2 tablets (1,000 mg total) by mouth in the morning and at bedtime. 360 tablet 1   TRUEplus Lancets 28G MISC Use as instructed to check blood sugar TID. 100 each 6   acetaminophen  (TYLENOL ) 500 MG tablet Take 1,000-2,000 mg by mouth daily as needed for headache (pain).     amLODipine  (NORVASC ) 10 MG tablet Take 1 tablet (10 mg total) by mouth daily. 30 tablet 0   valsartan  (DIOVAN ) 320 MG tablet Take 1 tablet (320 mg total) by mouth daily. 90 tablet 1   dextrose  (GLUTOSE) 40 % GEL Take 31 g by mouth once as needed for up to 1 dose for low blood sugar. (Patient not taking: Reported on 09/06/2023) 37.5 g 1   No facility-administered medications prior to visit.    No Known Allergies     Objective:    BP (!) 151/89 (BP Location: Left Arm, Patient Position: Sitting, Cuff Size: Normal)   Pulse 72   Resp 19   Ht 6' 0.5" (1.842 m)   Wt 227 lb (103 kg)   SpO2 98%   BMI 30.36 kg/m  Wt Readings from Last 3 Encounters:  11/16/23 227 lb (103 kg)  10/27/23 226 lb (102.5 kg)  09/06/23 234 lb (106.1 kg)    Physical Exam Vitals and nursing note reviewed.  Constitutional:      Appearance: He is well-developed.  HENT:     Head: Normocephalic and  atraumatic.  Cardiovascular:     Rate and Rhythm: Normal rate and regular rhythm.     Heart sounds: Normal heart sounds. No murmur heard.    No friction rub. No gallop.  Pulmonary:     Effort: Pulmonary effort is normal. No tachypnea or respiratory distress.     Breath sounds: Normal breath sounds. No decreased breath sounds, wheezing, rhonchi or rales.  Chest:     Chest wall: No tenderness.  Abdominal:     General: Bowel sounds are normal.     Palpations: Abdomen is soft.  Musculoskeletal:        General: Normal range of motion.  Cervical back: Normal range of motion.  Skin:    General: Skin is warm and dry.  Neurological:     Mental Status: He is alert and oriented to person, place, and time.     Coordination: Coordination normal.  Psychiatric:        Behavior: Behavior normal. Behavior is cooperative.        Thought Content: Thought content normal.        Judgment: Judgment normal.          Patient has been counseled extensively about nutrition and exercise as well as the importance of adherence with medications and regular follow-up. The patient was given clear instructions to go to ER or return to medical center if symptoms don't improve, worsen or new problems develop. The patient verbalized understanding.   Follow-up: Return in about 3 months (around 02/15/2024).   Collins Dean, FNP-BC [facility] and [facility] [place], [place] [phone]   01/08/2024, 8:45 PM

## 2023-11-17 ENCOUNTER — Encounter: Payer: Self-pay | Admitting: Nurse Practitioner

## 2023-11-17 LAB — CMP14+EGFR
ALT: 42 IU/L (ref 0–44)
AST: 62 IU/L — ABNORMAL HIGH (ref 0–40)
Albumin: 4.5 g/dL (ref 4.1–5.1)
Alkaline Phosphatase: 89 IU/L (ref 44–121)
BUN/Creatinine Ratio: 13 (ref 9–20)
BUN: 11 mg/dL (ref 6–24)
Bilirubin Total: 0.6 mg/dL (ref 0.0–1.2)
CO2: 22 mmol/L (ref 20–29)
Calcium: 9.5 mg/dL (ref 8.7–10.2)
Chloride: 102 mmol/L (ref 96–106)
Creatinine, Ser: 0.86 mg/dL (ref 0.76–1.27)
Globulin, Total: 2.6 g/dL (ref 1.5–4.5)
Glucose: 100 mg/dL — ABNORMAL HIGH (ref 70–99)
Potassium: 4.2 mmol/L (ref 3.5–5.2)
Sodium: 139 mmol/L (ref 134–144)
Total Protein: 7.1 g/dL (ref 6.0–8.5)
eGFR: 110 mL/min/{1.73_m2} (ref >59)

## 2023-11-17 NOTE — Telephone Encounter (Signed)
Will inform provider.

## 2023-11-19 NOTE — Telephone Encounter (Signed)
 [employer] states they faxed over a [facility] Form on 11/03/2023 to be completed and faxed back for the pts insulin pump/medical device. The representative states they do not use onbase, only parachute. She advised me a new form has been faxed over today that needs to be completed and returned  Fax # [phone]

## 2023-11-22 NOTE — Telephone Encounter (Signed)
 Spoke with Evona at [employer] they will refax form today.

## 2023-11-22 NOTE — Telephone Encounter (Signed)
 Forms received 11/22/2023 ([employer])

## 2023-11-23 ENCOUNTER — Telehealth: Payer: Self-pay

## 2023-11-23 NOTE — Telephone Encounter (Signed)
 Copied from CRM [id number]. Topic: General - Other >> Nov 22, 2023 11:54 AM Patsy Lager T wrote: Reason for CRM: [name] called from [employer] to verify if the request for the insulin pump was recvd via fax as she has faxed it 4x. She wants to see if there is an alternate way for her to send if if has not been recvd. Please f/u with [name] at [phone] ext [phone] >> Nov 23, 2023  1:37 PM Truddie Crumble wrote: Gareth Eagle called from [employer] stating the paperwork was done wrong. The nurse need to complete a new ccs physician order form because they crossed out the product needed for the patient on line four. The nurse was not suppose to touch number four. CB [phone] ext [phone] >> Nov 22, 2023  3:32 PM Rashiema C wrote: Received

## 2023-11-23 NOTE — Telephone Encounter (Signed)
Form has been filled and faxed.

## 2023-11-26 NOTE — Telephone Encounter (Signed)
 Corrections have been made and will fax once provider okays changes.

## 2023-11-26 NOTE — Telephone Encounter (Signed)
 [name] called back to report that they need a refax of the paperwork, needs new [facility] physician order because the physician crossed out all items on item 4. This was faxed on 11/23/2023.   Best contact: [phone] ext. [phone]

## 2023-11-26 NOTE — Telephone Encounter (Signed)
 She added her fax number: [phone]

## 2023-12-01 NOTE — Telephone Encounter (Signed)
 Forms have been faxed. 4/16/5

## 2023-12-01 NOTE — Telephone Encounter (Signed)
 FYI  Copied from CRM [id number]. Topic: General - Call Back - No Documentation  >> Dec 01, 2023 12:32 PM Georgeann Kindred wrote:  Reason for CRM: Anthony Schmidt, called regarding [facility] Physician Written order. She stated the Ruffin Cotton was assisting her. Reached out to CAL and she was not available. Please contact Anthony Schmidt at Phone: [phone] ext [phone].

## 2023-12-10 ENCOUNTER — Other Ambulatory Visit: Payer: Self-pay

## 2023-12-20 ENCOUNTER — Other Ambulatory Visit: Payer: Self-pay

## 2023-12-31 ENCOUNTER — Telehealth: Payer: Self-pay | Admitting: Nurse Practitioner

## 2023-12-31 ENCOUNTER — Encounter: Payer: Self-pay | Admitting: Nurse Practitioner

## 2023-12-31 NOTE — Telephone Encounter (Signed)
 Copied from CRM [id number]. Topic: General - Other >> Dec 31, 2023  2:46 PM Loreda Rodriguez T wrote:  Reason for CRM: Verita Glassman from [place] [phone] called stated they have been trying to reach patient via phone and email to get him a insulin  pump and sensor with no success. Candice Chalet said if they do not hear from him soon they will have to cancel the order

## 2023-12-31 NOTE — Telephone Encounter (Signed)
Unable to reach patient by phone. Unable to leave voicemail.

## 2024-01-08 ENCOUNTER — Encounter: Payer: Self-pay | Admitting: Nurse Practitioner

## 2024-01-14 NOTE — Progress Notes (Deleted)
 Office Visit Note  Patient: Anthony Schmidt             Date of Birth: 1981/03/05           MRN: [id number]             PCP: Collins Dean, NP Referring: Collins Dean, NP Visit Date: 01/28/2024   Subjective:  No chief complaint on file.   History of Present Illness: Anthony Schmidt is a 43 y.o. male here for follow up for continued joint pain and swelling.    Previous HPI 10/27/2023 Anthony Schmidt is a 43 y.o. male here for follow up for continued joint pain and swelling. Workup at our initial visit was negative for sed rate, gout, or Lyme. He had degenerative changes in the cervical spine and in his foot and ankle.   He has experienced swelling in both elbows and the right ankle since the last visit in January. The swelling lasts about a week and is managed with ice and topical patches, but the soreness persists. He experiences persistent pain in the elbows, particularly around the olecranon and epicondyle areas, which is exacerbated by certain movements. He has stopped lifting weights to see if it would alleviate the pain.   He is not taking any oral anti-inflammatory drugs regularly, as he was advised against ibuprofen  due to potential interactions with other medications. He uses Tylenol  occasionally and is currently taking meloxicam  but has not noticed any significant improvement in symptoms. He has not tried diclofenac .   He has pain in the right foot, particularly around the heel and ankle, which is consistent with plantar fasciitis or Achilles tendonitis. The pain is persistent and affects his daily activities.   He experiences hip pain when lying down, which affects his sleep. He does not currently take any medication for sleep but reports that the pain is the main factor keeping him awake.   He has a rash that appeared on his neck, which turned red, dried out, and then turned white. He applied Vaseline to the area.           Previous HPI 09/06/23 Anthony Schmidt is a 43  y.o. male with a history of diabetes and neuropathy, presents with a chief complaint of chronic, widespread joint pain and stiffness, which has been ongoing for over a year and a half. The pain is described as a constant 7 out of 10, with occasional increases in intensity. The patient reports that the pain is not localized to one specific area, but rather, it moves around to different joints, including the ankles, neck, and shoulders. The patient describes the pain as a burning sensation, similar to the feeling after a strenuous workout.   In addition to the pain, the patient experiences intermittent joint swelling, which also moves around to different joints. The swelling in each joint typically lasts for a few days to a week before subsiding. The patient denies any specific triggers for the swelling and notes that it occurs even on days when they are not physically active.   The patient has been managing the pain with over-the-counter Tylenol  and occasionally uses Icy Hot patches, but reports that these treatments provide minimal relief. The patient is also on a daily regimen of Meloxicam , an anti-inflammatory medication, and Lyrica  for neuropathy.   The patient denies any recent injuries, infections, or significant changes in lifestyle that could have triggered these symptoms. The patient also denies any family history of inflammatory or autoimmune conditions. The patient  has not noticed any associated rashes, vision changes, or severe bowel problems.   The patient has a history of knee surgery, which was performed many years ago due to injuries sustained while playing football. The patient denies any recent issues with the knees. The patient also reports dry eyes, which they attribute to working in a [employer].   The patient's symptoms have been persistent and have not improved with current treatments, leading to significant frustration and a desire for a better understanding of the underlying  cause.      02/2022 ANA neg RF neg CCP neg   No Rheumatology ROS completed.   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   Type 2 diabetes mellitus with hyperglycemia, with long-term current use of insulin  (HCC) 08/04/2022   Syncope and collapse 04/07/2020   Alcohol intoxication with delirium (HCC)    DKA (diabetic ketoacidosis) (HCC) 01/17/2020   Hyperkalemia 01/17/2020    Past Medical History:  Diagnosis Date   Diabetes mellitus, new onset (HCC) 01/2020   Hyperlipidemia    Hypertension     Family History  Problem Relation Age of Onset   Diabetes Mother    Hypertension Mother    Diabetes Father    Hypertension Father    Past Surgical History:  Procedure Laterality Date   KNEE ARTHROSCOPY Bilateral    KNEE SURGERY Bilateral    Social History   Social History Narrative   Not on file   Immunization History  Administered Date(s) Administered   Tdap 07/21/2023     Objective: Vital Signs: There were no vitals taken for this visit.   Physical Exam   Musculoskeletal Exam: ***  CDAI Exam: CDAI Score: -- Patient Global: --; Provider Global: -- Swollen: --; Tender: -- Joint Exam 01/28/2024   No joint exam has been documented for this visit   There is currently no information documented on the homunculus. Go to the Rheumatology activity and complete the homunculus joint exam.  Investigation: No additional findings.  Imaging: No results found.  Recent Labs: Lab Results  Component Value Date   WBC 5.9 03/18/2023   HGB 13.3 03/18/2023   PLT 196 03/18/2023   NA 139 11/16/2023   K 4.2 11/16/2023   CL 102 11/16/2023   CO2 22 11/16/2023   GLUCOSE 100 (H) 11/16/2023   BUN 11 11/16/2023   CREATININE 0.86 11/16/2023   BILITOT 0.6 11/16/2023   ALKPHOS 89 11/16/2023   AST 62 (H) 11/16/2023   ALT 42 11/16/2023   PROT 7.1 11/16/2023   ALBUMIN 4.5 11/16/2023   CALCIUM  9.5 11/16/2023   GFRAA  >60 04/08/2020    Speciality Comments: No specialty comments available.  Procedures:  No procedures performed Allergies: Patient has no known allergies.   Assessment / Plan:     Visit Diagnoses: No diagnosis found.  ***  Orders: No orders of the defined types were placed in this encounter.  No orders of the defined types were placed in this encounter.    Follow-Up Instructions: No follow-ups on file.   Glena Landau, RT  Note - This record has been created using [employer].  Chart creation errors have been sought, but may not always  have been located. Such creation errors do not reflect on  the standard of medical care.

## 2024-01-27 ENCOUNTER — Other Ambulatory Visit: Payer: Self-pay

## 2024-01-28 ENCOUNTER — Ambulatory Visit: Payer: PRIVATE HEALTH INSURANCE | Admitting: Internal Medicine

## 2024-01-28 DIAGNOSIS — M1380 Other specified arthritis, unspecified site: Secondary | ICD-10-CM

## 2024-01-28 DIAGNOSIS — G8929 Other chronic pain: Secondary | ICD-10-CM

## 2024-01-28 DIAGNOSIS — M25551 Pain in right hip: Secondary | ICD-10-CM

## 2024-02-07 ENCOUNTER — Other Ambulatory Visit: Payer: Self-pay

## 2024-02-08 ENCOUNTER — Other Ambulatory Visit: Payer: Self-pay

## 2024-02-15 ENCOUNTER — Ambulatory Visit: Payer: PRIVATE HEALTH INSURANCE | Admitting: Nurse Practitioner

## 2024-03-17 NOTE — Progress Notes (Deleted)
 Office Visit Note  Patient: Anthony Schmidt             Date of Birth: September 09, 1980           MRN: [id number]             PCP: Theotis Haze ORN, NP Referring: Theotis Haze ORN, NP Visit Date: 03/28/2024   Subjective:  No chief complaint on file.   History of Present Illness: Anthony Schmidt is a 43 y.o. male here for follow up for continued joint pain and swelling.    Previous HPI 10/27/2023 Anthony Schmidt is a 42 y.o. male here for follow up for continued joint pain and swelling. Workup at our initial visit was negative for sed rate, gout, or Lyme. He had degenerative changes in the cervical spine and in his foot and ankle.   He has experienced swelling in both elbows and the right ankle since the last visit in January. The swelling lasts about a week and is managed with ice and topical patches, but the soreness persists. He experiences persistent pain in the elbows, particularly around the olecranon and epicondyle areas, which is exacerbated by certain movements. He has stopped lifting weights to see if it would alleviate the pain.   He is not taking any oral anti-inflammatory drugs regularly, as he was advised against ibuprofen  due to potential interactions with other medications. He uses Tylenol  occasionally and is currently taking meloxicam  but has not noticed any significant improvement in symptoms. He has not tried diclofenac .   He has pain in the right foot, particularly around the heel and ankle, which is consistent with plantar fasciitis or Achilles tendonitis. The pain is persistent and affects his daily activities.   He experiences hip pain when lying down, which affects his sleep. He does not currently take any medication for sleep but reports that the pain is the main factor keeping him awake.   He has a rash that appeared on his neck, which turned red, dried out, and then turned white. He applied Vaseline to the area.           Previous HPI 09/06/23 Anthony Schmidt is a 44  y.o. male with a history of diabetes and neuropathy, presents with a chief complaint of chronic, widespread joint pain and stiffness, which has been ongoing for over a year and a half. The pain is described as a constant 7 out of 10, with occasional increases in intensity. The patient reports that the pain is not localized to one specific area, but rather, it moves around to different joints, including the ankles, neck, and shoulders. The patient describes the pain as a burning sensation, similar to the feeling after a strenuous workout.   In addition to the pain, the patient experiences intermittent joint swelling, which also moves around to different joints. The swelling in each joint typically lasts for a few days to a week before subsiding. The patient denies any specific triggers for the swelling and notes that it occurs even on days when they are not physically active.   The patient has been managing the pain with over-the-counter Tylenol  and occasionally uses Icy Hot patches, but reports that these treatments provide minimal relief. The patient is also on a daily regimen of Meloxicam , an anti-inflammatory medication, and Lyrica  for neuropathy.   The patient denies any recent injuries, infections, or significant changes in lifestyle that could have triggered these symptoms. The patient also denies any family history of inflammatory or autoimmune conditions. The patient  has not noticed any associated rashes, vision changes, or severe bowel problems.   The patient has a history of knee surgery, which was performed many years ago due to injuries sustained while playing football. The patient denies any recent issues with the knees. The patient also reports dry eyes, which they attribute to working in a [employer].   The patient's symptoms have been persistent and have not improved with current treatments, leading to significant frustration and a desire for a better understanding of the underlying  cause.      02/2022 ANA neg RF neg CCP neg   No Rheumatology ROS completed.   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   Type 2 diabetes mellitus with hyperglycemia, with long-term current use of insulin  (HCC) 08/04/2022   Syncope and collapse 04/07/2020   Alcohol intoxication with delirium (HCC)    DKA (diabetic ketoacidosis) (HCC) 01/17/2020   Hyperkalemia 01/17/2020    Past Medical History:  Diagnosis Date   Diabetes mellitus, new onset (HCC) 01/2020   Hyperlipidemia    Hypertension     Family History  Problem Relation Age of Onset   Diabetes Mother    Hypertension Mother    Diabetes Father    Hypertension Father    Past Surgical History:  Procedure Laterality Date   KNEE ARTHROSCOPY Bilateral    KNEE SURGERY Bilateral    Social History   Social History Narrative   Not on file   Immunization History  Administered Date(s) Administered   Tdap 07/21/2023     Objective: Vital Signs: There were no vitals taken for this visit.   Physical Exam   Musculoskeletal Exam: ***  CDAI Exam: CDAI Score: -- Patient Global: --; Provider Global: -- Swollen: --; Tender: -- Joint Exam 03/28/2024   No joint exam has been documented for this visit   There is currently no information documented on the homunculus. Go to the Rheumatology activity and complete the homunculus joint exam.  Investigation: No additional findings.  Imaging: No results found.  Recent Labs: Lab Results  Component Value Date   WBC 5.9 03/18/2023   HGB 13.3 03/18/2023   PLT 196 03/18/2023   NA 139 11/16/2023   K 4.2 11/16/2023   CL 102 11/16/2023   CO2 22 11/16/2023   GLUCOSE 100 (H) 11/16/2023   BUN 11 11/16/2023   CREATININE 0.86 11/16/2023   BILITOT 0.6 11/16/2023   ALKPHOS 89 11/16/2023   AST 62 (H) 11/16/2023   ALT 42 11/16/2023   PROT 7.1 11/16/2023   ALBUMIN 4.5 11/16/2023   CALCIUM  9.5 11/16/2023   GFRAA  >60 04/08/2020    Speciality Comments: No specialty comments available.  Procedures:  No procedures performed Allergies: Patient has no known allergies.   Assessment / Plan:     Visit Diagnoses: No diagnosis found.  ***  Orders: No orders of the defined types were placed in this encounter.  No orders of the defined types were placed in this encounter.    Follow-Up Instructions: No follow-ups on file.   Shelba SHAUNNA Potters, RT  Note - This record has been created using [employer].  Chart creation errors have been sought, but may not always  have been located. Such creation errors do not reflect on  the standard of medical care.

## 2024-03-21 ENCOUNTER — Ambulatory Visit: Payer: PRIVATE HEALTH INSURANCE | Admitting: Nurse Practitioner

## 2024-03-25 ENCOUNTER — Other Ambulatory Visit: Payer: Self-pay | Admitting: Nurse Practitioner

## 2024-03-25 DIAGNOSIS — Z794 Long term (current) use of insulin: Secondary | ICD-10-CM

## 2024-03-27 ENCOUNTER — Other Ambulatory Visit: Payer: Self-pay

## 2024-03-28 ENCOUNTER — Ambulatory Visit: Payer: PRIVATE HEALTH INSURANCE | Admitting: Internal Medicine

## 2024-03-28 ENCOUNTER — Other Ambulatory Visit: Payer: Self-pay

## 2024-03-28 DIAGNOSIS — Z79899 Other long term (current) drug therapy: Secondary | ICD-10-CM

## 2024-03-28 DIAGNOSIS — M1380 Other specified arthritis, unspecified site: Secondary | ICD-10-CM

## 2024-03-28 DIAGNOSIS — G8929 Other chronic pain: Secondary | ICD-10-CM

## 2024-03-28 DIAGNOSIS — M25551 Pain in right hip: Secondary | ICD-10-CM

## 2024-03-28 MED ORDER — TRUEPLUS 5-BEVEL PEN NEEDLES 31G X 5 MM MISC
6 refills | Status: DC
  Filled 2024-03-28: qty 100, 50d supply, fill #0

## 2024-03-31 ENCOUNTER — Other Ambulatory Visit: Payer: Self-pay

## 2024-04-04 ENCOUNTER — Other Ambulatory Visit: Payer: Self-pay

## 2024-04-05 ENCOUNTER — Other Ambulatory Visit: Payer: Self-pay

## 2024-04-06 ENCOUNTER — Other Ambulatory Visit: Payer: Self-pay

## 2024-04-18 ENCOUNTER — Telehealth: Payer: Self-pay | Admitting: Nurse Practitioner

## 2024-04-18 NOTE — Telephone Encounter (Signed)
Pt confirmed appt 9/2

## 2024-04-19 ENCOUNTER — Ambulatory Visit: Payer: PRIVATE HEALTH INSURANCE | Admitting: Nurse Practitioner

## 2024-05-04 NOTE — Progress Notes (Deleted)
 Office Visit Note  Patient: Anthony Schmidt             Date of Birth: 09-Jun-1981           MRN: [id number]             PCP: Theotis Haze ORN, NP Referring: Theotis Haze ORN, NP Visit Date: 05/15/2024   Subjective:  No chief complaint on file.   History of Present Illness: Anthony Schmidt is a 43 y.o. male here for follow up for continued joint pain and swelling.   Previous HPI 10/27/2023 Anthony Schmidt is a 43 y.o. male here for follow up for continued joint pain and swelling. Workup at our initial visit was negative for sed rate, gout, or Lyme. He had degenerative changes in the cervical spine and in his foot and ankle.   He has experienced swelling in both elbows and the right ankle since the last visit in January. The swelling lasts about a week and is managed with ice and topical patches, but the soreness persists. He experiences persistent pain in the elbows, particularly around the olecranon and epicondyle areas, which is exacerbated by certain movements. He has stopped lifting weights to see if it would alleviate the pain.   He is not taking any oral anti-inflammatory drugs regularly, as he was advised against ibuprofen  due to potential interactions with other medications. He uses Tylenol  occasionally and is currently taking meloxicam  but has not noticed any significant improvement in symptoms. He has not tried diclofenac .   He has pain in the right foot, particularly around the heel and ankle, which is consistent with plantar fasciitis or Achilles tendonitis. The pain is persistent and affects his daily activities.   He experiences hip pain when lying down, which affects his sleep. He does not currently take any medication for sleep but reports that the pain is the main factor keeping him awake.   He has a rash that appeared on his neck, which turned red, dried out, and then turned white. He applied Vaseline to the area.           Previous HPI 09/06/23 Anthony Schmidt is a 43  y.o. male with a history of diabetes and neuropathy, presents with a chief complaint of chronic, widespread joint pain and stiffness, which has been ongoing for over a year and a half. The pain is described as a constant 7 out of 10, with occasional increases in intensity. The patient reports that the pain is not localized to one specific area, but rather, it moves around to different joints, including the ankles, neck, and shoulders. The patient describes the pain as a burning sensation, similar to the feeling after a strenuous workout.   In addition to the pain, the patient experiences intermittent joint swelling, which also moves around to different joints. The swelling in each joint typically lasts for a few days to a week before subsiding. The patient denies any specific triggers for the swelling and notes that it occurs even on days when they are not physically active.   The patient has been managing the pain with over-the-counter Tylenol  and occasionally uses Icy Hot patches, but reports that these treatments provide minimal relief. The patient is also on a daily regimen of Meloxicam , an anti-inflammatory medication, and Lyrica  for neuropathy.   The patient denies any recent injuries, infections, or significant changes in lifestyle that could have triggered these symptoms. The patient also denies any family history of inflammatory or autoimmune conditions. The patient has  not noticed any associated rashes, vision changes, or severe bowel problems.   The patient has a history of knee surgery, which was performed many years ago due to injuries sustained while playing football. The patient denies any recent issues with the knees. The patient also reports dry eyes, which they attribute to working in a [employer].   The patient's symptoms have been persistent and have not improved with current treatments, leading to significant frustration and a desire for a better understanding of the underlying  cause.      02/2022 ANA neg RF neg CCP neg   No Rheumatology ROS completed.   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   Type 2 diabetes mellitus with hyperglycemia, with long-term current use of insulin  (HCC) 08/04/2022   Syncope and collapse 04/07/2020   Alcohol intoxication with delirium (HCC)    DKA (diabetic ketoacidosis) (HCC) 01/17/2020   Hyperkalemia 01/17/2020    Past Medical History:  Diagnosis Date   Diabetes mellitus, new onset (HCC) 01/2020   Hyperlipidemia    Hypertension     Family History  Problem Relation Age of Onset   Diabetes Mother    Hypertension Mother    Diabetes Father    Hypertension Father    Past Surgical History:  Procedure Laterality Date   KNEE ARTHROSCOPY Bilateral    KNEE SURGERY Bilateral    Social History   Social History Narrative   Not on file   Immunization History  Administered Date(s) Administered   Tdap 07/21/2023     Objective: Vital Signs: There were no vitals taken for this visit.   Physical Exam   Musculoskeletal Exam: ***  CDAI Exam: CDAI Score: -- Patient Global: --; Provider Global: -- Swollen: --; Tender: -- Joint Exam 05/15/2024   No joint exam has been documented for this visit   There is currently no information documented on the homunculus. Go to the Rheumatology activity and complete the homunculus joint exam.  Investigation: No additional findings.  Imaging: No results found.  Recent Labs: Lab Results  Component Value Date   WBC 5.9 03/18/2023   HGB 13.3 03/18/2023   PLT 196 03/18/2023   NA 139 11/16/2023   K 4.2 11/16/2023   CL 102 11/16/2023   CO2 22 11/16/2023   GLUCOSE 100 (H) 11/16/2023   BUN 11 11/16/2023   CREATININE 0.86 11/16/2023   BILITOT 0.6 11/16/2023   ALKPHOS 89 11/16/2023   AST 62 (H) 11/16/2023   ALT 42 11/16/2023   PROT 7.1 11/16/2023   ALBUMIN 4.5 11/16/2023   CALCIUM  9.5 11/16/2023   GFRAA  >60 04/08/2020    Speciality Comments: No specialty comments available.  Procedures:  No procedures performed Allergies: Patient has no known allergies.   Assessment / Plan:     Visit Diagnoses: No diagnosis found.  ***  Orders: No orders of the defined types were placed in this encounter.  No orders of the defined types were placed in this encounter.    Follow-Up Instructions: No follow-ups on file.   Abigale Dorow M Kevon Tench, CMA  Note - This record has been created using [employer].  Chart creation errors have been sought, but may not always  have been located. Such creation errors do not reflect on  the standard of medical care.

## 2024-05-12 ENCOUNTER — Other Ambulatory Visit (HOSPITAL_BASED_OUTPATIENT_CLINIC_OR_DEPARTMENT_OTHER): Payer: Self-pay

## 2024-05-15 ENCOUNTER — Ambulatory Visit: Payer: PRIVATE HEALTH INSURANCE | Admitting: Internal Medicine

## 2024-05-15 DIAGNOSIS — M1380 Other specified arthritis, unspecified site: Secondary | ICD-10-CM

## 2024-05-15 DIAGNOSIS — M25551 Pain in right hip: Secondary | ICD-10-CM

## 2024-06-13 ENCOUNTER — Ambulatory Visit: Payer: PRIVATE HEALTH INSURANCE | Attending: Nurse Practitioner | Admitting: Nurse Practitioner

## 2024-06-13 ENCOUNTER — Other Ambulatory Visit: Payer: Self-pay

## 2024-06-13 ENCOUNTER — Encounter: Payer: Self-pay | Admitting: Nurse Practitioner

## 2024-06-13 VITALS — BP 156/95 | HR 82 | Resp 18 | Ht 72.5 in | Wt 220.8 lb

## 2024-06-13 DIAGNOSIS — M25562 Pain in left knee: Secondary | ICD-10-CM | POA: Diagnosis not present

## 2024-06-13 DIAGNOSIS — G8929 Other chronic pain: Secondary | ICD-10-CM

## 2024-06-13 DIAGNOSIS — E1159 Type 2 diabetes mellitus with other circulatory complications: Secondary | ICD-10-CM

## 2024-06-13 DIAGNOSIS — M25511 Pain in right shoulder: Secondary | ICD-10-CM

## 2024-06-13 DIAGNOSIS — E1165 Type 2 diabetes mellitus with hyperglycemia: Secondary | ICD-10-CM

## 2024-06-13 DIAGNOSIS — E785 Hyperlipidemia, unspecified: Secondary | ICD-10-CM

## 2024-06-13 DIAGNOSIS — Z794 Long term (current) use of insulin: Secondary | ICD-10-CM

## 2024-06-13 DIAGNOSIS — I152 Hypertension secondary to endocrine disorders: Secondary | ICD-10-CM

## 2024-06-13 DIAGNOSIS — M1380 Other specified arthritis, unspecified site: Secondary | ICD-10-CM

## 2024-06-13 LAB — POCT GLYCOSYLATED HEMOGLOBIN (HGB A1C): Hemoglobin A1C: 8.1 % — AB (ref 4.0–5.6)

## 2024-06-13 MED ORDER — VALSARTAN-HYDROCHLOROTHIAZIDE 320-25 MG PO TABS
1.0000 | ORAL_TABLET | Freq: Every day | ORAL | 3 refills | Status: AC
  Filled 2024-06-13: qty 90, 90d supply, fill #0
  Filled 2024-09-11: qty 90, 90d supply, fill #1

## 2024-06-13 MED ORDER — HUMULIN 70/30 KWIKPEN (70-30) 100 UNIT/ML ~~LOC~~ SUPN
10.0000 [IU] | PEN_INJECTOR | Freq: Two times a day (BID) | SUBCUTANEOUS | 1 refills | Status: DC
  Filled 2024-06-13: qty 18, 90d supply, fill #0

## 2024-06-13 MED ORDER — ATORVASTATIN CALCIUM 80 MG PO TABS
80.0000 mg | ORAL_TABLET | Freq: Every day | ORAL | 1 refills | Status: AC
  Filled 2024-06-13: qty 90, 90d supply, fill #0
  Filled 2024-09-11: qty 90, 90d supply, fill #1

## 2024-06-13 MED ORDER — DICLOFENAC SODIUM 75 MG PO TBEC
75.0000 mg | DELAYED_RELEASE_TABLET | Freq: Two times a day (BID) | ORAL | 2 refills | Status: DC | PRN
  Filled 2024-06-13: qty 60, 30d supply, fill #0
  Filled 2024-07-18: qty 60, 30d supply, fill #1
  Filled 2024-09-04: qty 60, 30d supply, fill #2

## 2024-06-13 MED ORDER — INSULIN LISPRO PROT & LISPRO (75-25 MIX) 100 UNIT/ML KWIKPEN
12.0000 [IU] | PEN_INJECTOR | Freq: Two times a day (BID) | SUBCUTANEOUS | 11 refills | Status: AC
  Filled 2024-06-13: qty 9, 38d supply, fill #0
  Filled 2024-08-03: qty 9, 38d supply, fill #1
  Filled 2024-09-11: qty 9, 38d supply, fill #2

## 2024-06-13 MED ORDER — AMLODIPINE BESYLATE 10 MG PO TABS
10.0000 mg | ORAL_TABLET | Freq: Every day | ORAL | 1 refills | Status: AC
  Filled 2024-06-13: qty 90, 90d supply, fill #0
  Filled 2024-09-11: qty 90, 90d supply, fill #1

## 2024-06-13 MED ORDER — TRUEPLUS 5-BEVEL PEN NEEDLES 31G X 5 MM MISC
6 refills | Status: AC
  Filled 2024-06-13: qty 100, 50d supply, fill #0
  Filled 2024-09-04: qty 100, 50d supply, fill #1

## 2024-06-13 NOTE — Progress Notes (Signed)
 Assessment & Plan:  Himmat was seen today for diabetes and joint swelling.  Diagnoses and all orders for this visit:  Type 2 diabetes mellitus with hyperglycemia, with long-term current use of insulin  (HCC) -     POCT glycosylated hemoglobin (Hb A1C). -     Insulin  Pen Needle (TRUEPLUS 5-BEVEL PEN NEEDLES) 31G X 5 MM MISC; USE 2 TIMES A DAY AS DIRECTED -     CMP14+EGFR -     Insulin  Lispro Prot & Lispro (HUMALOG MIX 75/25 KWIKPEN) (75-25) 100 UNIT/ML Kwikpen; Inject 12 Units into the skin 2 (two) times daily. A1c increased from 7 to 8, indicating worsening glycemic control. Insulin  not picked up since December, possibly expired, contributing to poor control. - Increase insulin  dose to 12 units twice a day. - Check expiration dates on insulin  pens and discard expired ones. - Send new prescription for insulin .   Hypertension associated with diabetes (HCC) -     amLODipine  (NORVASC ) 10 MG tablet; Take 1 tablet (10 mg total) by mouth daily. -     valsartan -hydrochlorothiazide  (DIOVAN -HCT) 320-25 MG tablet; Take 1 tablet by mouth daily. Blood pressure not well controlled, possibly due to inconsistent medication adherence. Amlodipine  and valsartan  not picked up since April, suggesting non-compliance. - Ensure adherence to current antihypertensive regimen. - Educated on the importance of daily medication adherence. - Review and refill prescriptions for amlodipine  and valsartan .   Chronic right shoulder pain -     Ambulatory referral to Orthopedic Surgery Chronic shoulder pain with no significant findings on previous x-ray. - Refer to orthopedics for further evaluation of shoulder pain.   Chronic pain of left knee -     Ambulatory referral to Orthopedic Surgery Chronic left knee swelling for three months, possibly related to rheumatoid arthritis. No recent imaging available. - Order x-ray of the left knee to assess for fluid accumulation. - Consider referral to orthopedics if x-ray  indicates need for further intervention.   Migratory polyarthritis -     diclofenac  (VOLTAREN ) 75 MG EC tablet; Take 1 tablet (75 mg total) by mouth 2 (two) times daily as needed.  Dyslipidemia, goal LDL below 70 -     atorvastatin  (LIPITOR) 80 MG tablet; Take 1 tablet (80 mg total) by mouth daily.    Patient has been counseled on age-appropriate routine health concerns for screening and prevention. These are reviewed and up-to-date. Referrals have been placed accordingly. Immunizations are up-to-date or declined.    Subjective:   Chief Complaint  Patient presents with   Diabetes   Joint Swelling    Left knee, right hand lump and right shoulder.    Anthony Schmidt 43 y.o. male presents to office today for follow up to DM and HTN I have not seen him in 6 months   He has a past medical history of  migratory polyarthritis, DM2 (01/2020), Hyperlipidemia, and Hypertension.   His diabetes management includes insulin , administered at 10 units twice a day. Blood sugar levels range from 117 to 163 mg/dL. Several of his medication bottles are dated from March/April. His insulin  shows an expiration date of 08-2023. He can not recall the last time he picked up any of his medications.  Lab Results  Component Value Date   HGBA1C 8.1 (A) 06/13/2024   BP Readings from Last 3 Encounters:  06/13/24 (!) 156/95  11/16/23 (!) 151/89  10/27/23 (!) 148/96    Lab Results  Component Value Date   LDLCALC 55 07/21/2023    He reports  swelling in his left knee and right hand which comes and goes, with knee swelling persisting for about three months now. He has a history of knee surgeries due to football injuries, with arthroscopy performed on both knees in the late 1990s. He was prescribed diclofenac  (Voltaren ) 75 mg twice a day for pain and swelling but has not been taking as prescribed. His inflammatory markers were negative for gout or RA. Seeing rheumatology for polyarthritis.      Review of  Systems  Constitutional:  Negative for fever, malaise/fatigue and weight loss.  HENT: Negative.  Negative for nosebleeds.   Eyes: Negative.  Negative for blurred vision, double vision and photophobia.  Respiratory: Negative.  Negative for cough and shortness of breath.   Cardiovascular: Negative.  Negative for chest pain, palpitations and leg swelling.  Gastrointestinal: Negative.  Negative for heartburn, nausea and vomiting.  Musculoskeletal:  Positive for joint pain. Negative for myalgias.  Neurological: Negative.  Negative for dizziness, focal weakness, seizures and headaches.  Psychiatric/Behavioral: Negative.  Negative for suicidal ideas.     Past Medical History:  Diagnosis Date   Diabetes mellitus, new onset (HCC) 01/2020   Hyperlipidemia    Hypertension     Past Surgical History:  Procedure Laterality Date   KNEE ARTHROSCOPY Bilateral    KNEE SURGERY Bilateral     Family History  Problem Relation Age of Onset   Diabetes Mother    Hypertension Mother    Diabetes Father    Hypertension Father     Social History Reviewed with no changes to be made today.   Outpatient Medications Prior to Visit  Medication Sig Dispense Refill   blood glucose meter kit and supplies KIT Dispense based on patient and insurance preference. Use up to four times daily as directed. (FOR ICD-9 250.00, 250.01). 1 each 0   Blood Glucose Monitoring Suppl (TRUE METRIX METER) w/Device KIT Use as instructed to check blood sugar TID. 1 kit 0   glucose blood (TRUE METRIX BLOOD GLUCOSE TEST) test strip Use as instructed to check blood sugar three times daily 100 each 2   metFORMIN  (GLUCOPHAGE -XR) 500 MG 24 hr tablet Take 2 tablets (1,000 mg total) by mouth in the morning and at bedtime. 360 tablet 1   TRUEplus Lancets 28G MISC Use as instructed to check blood sugar TID. 100 each 6   amLODipine  (NORVASC ) 10 MG tablet Take 1 tablet (10 mg total) by mouth daily. 90 tablet 1   atorvastatin  (LIPITOR) 80 MG  tablet Take 1 tablet (80 mg total) by mouth daily. 90 tablet 1   insulin  isophane & regular human KwikPen (HUMULIN  70/30 KWIKPEN) (70-30) 100 UNIT/ML KwikPen Inject 10 Units into the skin in the morning and at bedtime. 18 mL 1   Insulin  Pen Needle (TRUEPLUS 5-BEVEL PEN NEEDLES) 31G X 5 MM MISC USE 2 TIMES A DAY AS DIRECTED 100 each 6   valsartan -hydrochlorothiazide  (DIOVAN -HCT) 320-25 MG tablet Take 1 tablet by mouth daily. 90 tablet 3   cyclobenzaprine  (FLEXERIL ) 10 MG tablet Take 1 tablet (10 mg total) by mouth at bedtime as needed. (Patient not taking: Reported on 06/13/2024) 30 tablet 2   dextrose  (GLUTOSE) 40 % GEL Take 31 g by mouth once as needed for up to 1 dose for low blood sugar. (Patient not taking: Reported on 06/13/2024) 37.5 g 1   diclofenac  (VOLTAREN ) 75 MG EC tablet Take 1 tablet (75 mg total) by mouth 2 (two) times daily as needed. (Patient not taking: Reported on  06/13/2024) 60 tablet 2   No facility-administered medications prior to visit.    No Known Allergies     Objective:    BP (!) 156/95 (BP Location: Right Arm, Patient Position: Sitting, Cuff Size: Normal)   Pulse 82   Resp 18   Ht 6' 0.5 (1.842 m)   Wt 220 lb 12.8 oz (100.2 kg)   SpO2 100%   BMI 29.53 kg/m  Wt Readings from Last 3 Encounters:  06/13/24 220 lb 12.8 oz (100.2 kg)  11/16/23 227 lb (103 kg)  10/27/23 226 lb (102.5 kg)    Physical Exam Vitals and nursing note reviewed.  Constitutional:      Appearance: He is well-developed.  HENT:     Head: Normocephalic and atraumatic.  Cardiovascular:     Rate and Rhythm: Normal rate and regular rhythm.     Heart sounds: Normal heart sounds. No murmur heard.    No friction rub. No gallop.  Pulmonary:     Effort: Pulmonary effort is normal. No tachypnea or respiratory distress.     Breath sounds: Normal breath sounds. No decreased breath sounds, wheezing, rhonchi or rales.  Chest:     Chest wall: No tenderness.  Musculoskeletal:        General:  Normal range of motion.     Right hand: Deformity present.       Arms:     Cervical back: Normal range of motion.     Left knee: Swelling present.  Skin:    General: Skin is warm and dry.  Neurological:     Mental Status: He is alert and oriented to person, place, and time.     Coordination: Coordination normal.  Psychiatric:        Behavior: Behavior normal. Behavior is cooperative.        Thought Content: Thought content normal.        Judgment: Judgment normal.          Patient has been counseled extensively about nutrition and exercise as well as the importance of adherence with medications and regular follow-up. The patient was given clear instructions to go to ER or return to medical center if symptoms don't improve, worsen or new problems develop. The patient verbalized understanding.   Follow-up: Return in about 3 months (around 09/21/2024).   Haze LELON Servant, FNP-BC [facility] [place], [place] [phone]   06/13/2024, 5:53 PM

## 2024-06-14 LAB — CMP14+EGFR
ALT: 17 IU/L (ref 0–44)
AST: 31 IU/L (ref 0–40)
Albumin: 4.4 g/dL (ref 4.1–5.1)
Alkaline Phosphatase: 95 IU/L (ref 47–123)
BUN/Creatinine Ratio: 13 (ref 9–20)
BUN: 13 mg/dL (ref 6–24)
Bilirubin Total: 0.6 mg/dL (ref 0.0–1.2)
CO2: 21 mmol/L (ref 20–29)
Calcium: 10.1 mg/dL (ref 8.7–10.2)
Chloride: 100 mmol/L (ref 96–106)
Creatinine, Ser: 1.03 mg/dL (ref 0.76–1.27)
Globulin, Total: 3.3 g/dL (ref 1.5–4.5)
Glucose: 125 mg/dL — ABNORMAL HIGH (ref 70–99)
Potassium: 4 mmol/L (ref 3.5–5.2)
Sodium: 137 mmol/L (ref 134–144)
Total Protein: 7.7 g/dL (ref 6.0–8.5)
eGFR: 92 mL/min/1.73 (ref >59)

## 2024-06-15 ENCOUNTER — Ambulatory Visit: Payer: Self-pay | Admitting: Nurse Practitioner

## 2024-06-15 ENCOUNTER — Other Ambulatory Visit: Payer: Self-pay

## 2024-06-20 ENCOUNTER — Other Ambulatory Visit: Payer: Self-pay

## 2024-06-21 ENCOUNTER — Other Ambulatory Visit: Payer: Self-pay

## 2024-07-18 ENCOUNTER — Encounter: Payer: Self-pay | Admitting: Sports Medicine

## 2024-07-18 ENCOUNTER — Other Ambulatory Visit: Payer: PRIVATE HEALTH INSURANCE

## 2024-07-18 ENCOUNTER — Ambulatory Visit: Payer: PRIVATE HEALTH INSURANCE | Admitting: Sports Medicine

## 2024-07-18 ENCOUNTER — Other Ambulatory Visit: Payer: Self-pay

## 2024-07-18 DIAGNOSIS — M25511 Pain in right shoulder: Secondary | ICD-10-CM | POA: Diagnosis not present

## 2024-07-18 DIAGNOSIS — G8929 Other chronic pain: Secondary | ICD-10-CM

## 2024-07-18 DIAGNOSIS — M1732 Unilateral post-traumatic osteoarthritis, left knee: Secondary | ICD-10-CM | POA: Diagnosis not present

## 2024-07-18 DIAGNOSIS — M545 Low back pain, unspecified: Secondary | ICD-10-CM

## 2024-07-18 DIAGNOSIS — M25562 Pain in left knee: Secondary | ICD-10-CM | POA: Diagnosis not present

## 2024-07-18 DIAGNOSIS — R202 Paresthesia of skin: Secondary | ICD-10-CM

## 2024-07-18 DIAGNOSIS — R200 Anesthesia of skin: Secondary | ICD-10-CM

## 2024-07-18 MED ORDER — METHYLPREDNISOLONE ACETATE 40 MG/ML IJ SUSP
40.0000 mg | INTRAMUSCULAR | Status: AC | PRN
  Administered 2024-07-18: 40 mg via INTRA_ARTICULAR

## 2024-07-18 MED ORDER — LIDOCAINE HCL 1 % IJ SOLN
2.0000 mL | INTRAMUSCULAR | Status: AC | PRN
  Administered 2024-07-18: 2 mL

## 2024-07-18 MED ORDER — BUPIVACAINE HCL 0.25 % IJ SOLN
2.0000 mL | INTRAMUSCULAR | Status: AC | PRN
  Administered 2024-07-18: 2 mL via INTRA_ARTICULAR

## 2024-07-18 NOTE — Progress Notes (Signed)
 Patient says that he has had right shoulder pain for about 8 months. He says that it begins in the top of the shoulder, and he does get popping and clicking. He denies having had any injury to that shoulder. His pain was worsening, although has leveled out some in the last three months. He is right-hand dominant, so has difficulty with ADLs.   Patient says that he has had pain and swelling in the left knee for over a year. He says that he will get sharp pain in the front of the knee, and has noticed popping as well. He has stopped playing basketball, but is still active in the gym and in his work. He does have pain, numbness, and tingling from the low back, down the back of the left leg and into the calf. He says that this pain and numbness has caused several falls when he begins walking after sitting.   Patient was prescribed Cyclobenzaprine , which he takes only as needed. He has tried ice for the knee, and uses patches on both the shoulder and the knee.

## 2024-07-18 NOTE — Progress Notes (Signed)
 Anthony Schmidt - 43 y.o. male MRN [id number]  Date of birth: Dec 09, 1980  Office Visit Note: Visit Date: 07/18/2024 PCP: Theotis Haze ORN, NP Referred by: Theotis Haze ORN, NP  Subjective: Chief Complaint  Patient presents with   Right Shoulder - Pain   Left Knee - Pain   Left Leg - Pain   HPI: Anthony Schmidt is a pleasant 43 y.o. male who presents today for chronic right shoulder pain, left knee and leg pain with occ N/T.  Right shoulder -  he has right shoulder pain for about 8 months. He says that it begins in the top of the shoulder, and he does get popping and clicking. He denies having had any injury to that shoulder. His pain was worsening, although has leveled out some in the last three months. He is right-hand dominant, so has difficulty with ADLs. Does notice clicking/popping.  Left knee/leg - has a history of surgery for both the left and right knee.  He did report this was knee replacement, however his x-rays demonstrate likely arthroscopy with ACL repair as he does have ACL tunneling in both knees.  The left knee was performed in 1996, the right knee in 1997.  He was previously a presenter, broadcasting and even played semiprofessionally.  These surgeries were performed by Dr. Redell Rast.  For the last year or more he has noticed more pain and swelling in the left knee, this is not present all of the time but he has had to hold back from basketball and other activities in the gym due to flaring this up.  He is taking 1000 mg of ibuprofen  4-6 times daily which he knows he should not but is not significantly helpful.  Was also previously prescribed cyclobenzaprine .  He also gets occasional numbness and tingling down the left leg that starts in the region of the back/posterior buttock and radiates down the posterior leg to about the level of the posterior knee/upper calf.  He really notices this when he sits in his truck for long periods of time and then goes to stand up.  Lab  Results  Component Value Date   HGBA1C 8.1 (A) 06/13/2024   Pertinent ROS were reviewed with the patient and found to be negative unless otherwise specified above in HPI.   Assessment & Plan: Visit Diagnoses:  1. Post-traumatic osteoarthritis of left knee   2. Chronic pain of left knee   3. Chronic low back pain, unspecified back pain laterality, unspecified whether sciatica present   4. Chronic right shoulder pain   5. Numbness and tingling of left leg    Plan: Impression is acute on chronic left knee pain with intermittent swelling, in the setting of likely previous arthroscopy with ACL repair (both knees 96' and 97').  It is possible they may have used a cadaver graft as his left knee has a slight amount of laxity with Lachman testing although no significant giveway.  He has developed a degree of posttraumatic arthritis of both knees as well and his left knee is most advanced in the patellofemoral region.  He is taking a rather excessive amount of ibuprofen , we discussed cutting this in half of his normal daily dose to reduce side effect profile.  Through shared decision making, we did proceed with left knee corticosteroid injection, patient tolerated well.  In terms of his right shoulder, his x-rays show no bony or structural abnormality.  He does have some clicking/popping in the shoulder joint which does raise  my suspicion for possible labral involvement.  He does have some pain with activation of this in the rotator cuff.  We will start him in formalized physical therapy for strengthening and stability of the shoulder as well as his knees.  He is describing transient tingling down the posterior left leg, I think this is more position dependent from prolonged sitting in his truck for work with a degree of piriformis syndrome.  I have low suspicion this is radicular symptoms coming from the back.  We will follow-up on all of this in the next 6 weeks.  Okay to use his Flexeril  10 mg only as needed  but does not take this when driving due to sedative effect.  Follow-up: Return in about 6 weeks (around 08/29/2024) for L-knee, R-shoulder   Meds & Orders: No orders of the defined types were placed in this encounter.   Orders Placed This Encounter  Procedures   Large Joint Inj: L knee   XR Knee Complete 4 Views Left   XR Lumbar Spine Complete   Ambulatory referral to Physical Therapy     Procedures: Large Joint Inj: L knee on 07/18/2024 9:50 AM Indications: pain Details: 22 G 1.5 in needle, anterolateral approach Medications: 2 mL lidocaine 1 %; 2 mL bupivacaine 0.25 %; 40 mg methylPREDNISolone acetate 40 MG/ML Outcome: tolerated well, no immediate complications  Knee Injection, Left: After discussion on risks/benefits/indications, informed verbal consent was obtained and a timeout was performed, patient was seated on exam table. The patient's knee was prepped with Betadine and alcohol swab and utilizing anterolateral approach, the patient's knee was injected intraarticularly with 2:2:1 lidocaine 1%:bupivicaine 0.25%:depomedrol. Patient tolerated the procedure well without immediate complications.  Procedure, treatment alternatives, risks and benefits explained, specific risks discussed. Consent was given by the patient. Patient was prepped and draped in the usual sterile fashion.          Clinical History: No specialty comments available.  He reports that he has never smoked. He has never been exposed to tobacco smoke. He has never used smokeless tobacco.  Recent Labs    07/21/23 1047 09/06/23 1427 11/16/23 1557 06/13/24 1359  HGBA1C 7.2*  --  7.0* 8.1*  LABURIC  --  7.3  --   --     Objective:    Physical Exam  Gen: Well-appearing, in no acute distress; non-toxic CV: Well-perfused. Warm.  Resp: Breathing unlabored on room air; no wheezing. Psych: Fluid speech in conversation; appropriate affect; normal thought process  Ortho Exam - Bilateral knees: The left knee  has a trace effusion on the left knee, no significant effusion of the right knee.  Range of motion is slightly limited from about 5-130 degrees of the left knee, 3-130 degrees of the right knee.  There is a mild degree of laxity with Lachman testing of the left knee, right knee is stable.  There is patellofemoral crepitus noted left > right.  - Right shoulder: No redness swelling or effusion.  There is tenderness over the [facility] joint mildly.  There is full active and passive range of motion.  Positive O'Brien's test, there is some pain with crossarm adduction.  There is mild discomfort with empty can testing although no weakness of the rotator cuff noted.  Imaging: XR Knee Complete 4 Views Left Result Date: 07/18/2024 4 view x-ray of the left knee including bilateral standing AP, Rosenberg, left leg lateral and sunrise view was ordered and reviewed by myself today.  X-rays demonstrate tibial tunnel with hardware  from likely previous ACL graft/reconstruction present in both knees.  There is at least mild tibiofemoral joint space narrowing with osteophytosis and spurring.  There is moderate patellofemoral narrowing and superior enthesophyte spurs.  XR Lumbar Spine Complete Result Date: 07/18/2024 Complete lumbar spine x-rays including AP, lateral, flexion/extension views were ordered and reviewed by myself today.  X-rays demonstrate 5 nonrib-bearing lumbar vertebra.  There is no significant lumbar DDD.  There is good alignment of the lumbar spine.  There is no instability with flexion/extension views.  No acute abnormality noted.   *I did review his right shoulder x-ray from 09/06/2023 during the visit today:  X-ray right shoulder 4 views   Normal internal and external rotation humerus position.  Glenohumeral  joint space appears normal.  Acromiohumeral distance is normal.  AC joint  appears normal. No significant appearing degenerative changes . No  definite effusions or abnormal calcifications seen.    Past Medical/Family/Surgical/Social History: Medications & Allergies reviewed per EMR, new medications updated. 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   Type 2 diabetes mellitus with hyperglycemia, with long-term current use of insulin  (HCC) 08/04/2022   Syncope and collapse 04/07/2020   Alcohol intoxication with delirium    DKA (diabetic ketoacidosis) (HCC) 01/17/2020   Hyperkalemia 01/17/2020   Past Medical History:  Diagnosis Date   Diabetes mellitus, new onset (HCC) 01/2020   Hyperlipidemia    Hypertension    Family History  Problem Relation Age of Onset   Diabetes Mother    Hypertension Mother    Diabetes Father    Hypertension Father    Past Surgical History:  Procedure Laterality Date   KNEE ARTHROSCOPY Bilateral    KNEE SURGERY Bilateral    Social History   Occupational History   Not on file  Tobacco Use   Smoking status: Never    Passive exposure: Never   Smokeless tobacco: Never  Vaping Use   Vaping status: Never Used  Substance and Sexual Activity   Alcohol use: Not Currently    Alcohol/week: 6.0 standard drinks of alcohol    Types: 6 Cans of beer per week    Comment: occassionally   Drug use: Never   Sexual activity: Yes

## 2024-07-26 ENCOUNTER — Other Ambulatory Visit: Payer: Self-pay

## 2024-07-26 ENCOUNTER — Ambulatory Visit: Payer: PRIVATE HEALTH INSURANCE | Admitting: Physical Therapy

## 2024-07-26 DIAGNOSIS — M25511 Pain in right shoulder: Secondary | ICD-10-CM

## 2024-07-26 DIAGNOSIS — M1732 Unilateral post-traumatic osteoarthritis, left knee: Secondary | ICD-10-CM | POA: Insufficient documentation

## 2024-07-26 DIAGNOSIS — M25562 Pain in left knee: Secondary | ICD-10-CM | POA: Insufficient documentation

## 2024-07-26 DIAGNOSIS — G8929 Other chronic pain: Secondary | ICD-10-CM | POA: Insufficient documentation

## 2024-07-26 NOTE — Therapy (Cosign Needed)
 OUTPATIENT PHYSICAL THERAPY LOWER EXTREMITY EVALUATION   Patient Name: Anthony Schmidt MRN: [id number] DOB:July 07, 1981, 43 y.o., male Today's Date: 07/26/2024  END OF SESSION:  PT End of Session - 07/26/24 1649     Visit Number 1    Number of Visits 17    Date for Recertification  09/20/24    Authorization Type [employer]    PT Start Time 1447    PT Stop Time 1530    PT Time Calculation (min) 43 min    Activity Tolerance Patient tolerated treatment well    Behavior During Therapy [facility] for tasks assessed/performed          Past Medical History:  Diagnosis Date   Diabetes mellitus, new onset (HCC) 01/2020   Hyperlipidemia    Hypertension    Past Surgical History:  Procedure Laterality Date   KNEE ARTHROSCOPY Bilateral    KNEE SURGERY Bilateral    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   Type 2 diabetes mellitus with hyperglycemia, with long-term current use of insulin  (HCC) 08/04/2022   Syncope and collapse 04/07/2020   Alcohol intoxication with delirium    DKA (diabetic ketoacidosis) (HCC) 01/17/2020   Hyperkalemia 01/17/2020    PCP: Haze Arlington, NP  REFERRING PROVIDER: Lonell Sprang, DO  REFERRING DIAG:  Diagnosis  [phone] (ICD-10-CM) - Chronic pain of left knee  M17.32 (ICD-10-CM) - Post-traumatic osteoarthritis of left knee  M25.511,G89.29 (ICD-10-CM) - Chronic right shoulder pain    THERAPY DIAG:  Right shoulder pain, unspecified chronicity  Left knee pain, unspecified chronicity  Rationale for Evaluation and Treatment: Rehabilitation  ONSET DATE: Left Knee: October 2024 Right shoulder - around Dec 2024  SUBJECTIVE:   SUBJECTIVE STATEMENT: Pt reports approx. 1 year ago he was playing basketball like normal, started to notice unusual swelling and pain in left knee. He iced it and symptoms resolved for a time. However symptoms returned a few weeks later, where ice does not resolve the  problem anymore. He checked in with his doctor, who examined him, preferring  he try PT first before getting an injection. States he constantly feels numbness from his left sided low back down into back of left knee. He came to therapy as referred by Dr Sprang, and wants to try and resolve left knee/right shoulder issues without having to get an injection.    PERTINENT HISTORY: Type 2 Diabetes DM new onset in 2021 Diabetic Ketoacidosis Syncope and collapse  Hyperkalemia  PAIN:  Are you having pain? Yes: NPRS scale: 10/10 left knee at worse, currently 6/10   Pain location: posterior knee cap, numbness starting in left sided low back radiating into left leg to posterior knee Pain description: sharp, dull ache,  Aggravating factors: sitting for 2 hours or more,  Relieving factors: ice pack, ice  PRECAUTIONS: None  RED FLAGS: None   WEIGHT BEARING RESTRICTIONS: No  FALLS:  Has patient fallen in last 6 months? Yes. 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  DIAGNOSTIC FINDINGS:  Left knee: 4 view x-ray of the left knee including bilateral standing AP, Rosenberg,  left leg lateral and sunrise view was ordered and reviewed by myself  today.  X-rays demonstrate tibial tunnel with hardware from likely  previous ACL graft/reconstruction present in both knees.  There is at  least mild tibiofemoral joint space narrowing with osteophytosis and  spurring.  There is moderate  patellofemoral narrowing and superior  enthesophyte spurs.   Right shoulder: Normal internal and external rotation humerus position.  Glenohumeral  joint space appears normal.  Acromiohumeral distance is normal.  AC joint  appears normal. No significant appearing degenerative changes . No  definite effusions or abnormal calcifications seen.    PATIENT SURVEYS:  LEFS  Extreme difficulty/unable (0), Quite a bit of difficulty (1), Moderate difficulty (2), Little difficulty (3), No difficulty (4) Survey date:  07/26/24  Any of your usual work, housework or school activities 2  2. Usual hobbies, recreational or sporting activities 0  3. Getting into/out of the bath 4  4. Walking between rooms 4  5. Putting on socks/shoes 4  6. Squatting  1  7. Lifting an object, like a bag of groceries from the floor 4  8. Performing light activities around your home 2  9. Performing heavy activities around your home 1  10. Getting into/out of a car 4  11. Walking 2 blocks 2  12. Walking 1 mile 1  13. Going up/down 10 stairs (1 flight) 1  14. Standing for 1 hour 1  15.  sitting for 1 hour 0 falls  16. Running on even ground 1  17. Running on uneven ground 1  18. Making sharp turns while running fast 1  19. Hopping  0  20. Rolling over in bed 4  Score total:  38/80   LEFS: 47%  COGNITION: Overall cognitive status: Within functional limits for tasks assessed     SENSATION: Not tested  EDEMA:  No edema present   MUSCLE LENGTH: Negative Thomas test bilaterally  Positive Ely's test on left leg  POSTURE: rounded shoulders and forward head   LOWER EXTREMITY ROM:  Active ROM Right eval Left eval  Hip flexion  WFL  Hip extension  [facility]  Hip abduction    Hip adduction    Hip internal rotation  WFL  Hip external rotation  [facility]  Knee flexion  111 A , 122 passive   Knee extension  Lacking 10 deg  Ankle dorsiflexion    Ankle plantarflexion    Ankle inversion    Ankle eversion      (Blank rows = not tested)  LOWER EXTREMITY MMT:  MMT Right eval Left eval  Hip flexion 4- 4  Hip extension 4+ 4+  Hip abduction 4 4  Hip adduction    Hip internal rotation 4 4-  Hip external rotation 4+ 4+  Knee flexion 4+ 4+  Knee extension 5 5  Ankle dorsiflexion 4+ 4+  Ankle plantarflexion    Ankle inversion    Ankle eversion     (Blank rows = not tested) UPPER EXTREMITY MMT:  NO time to test UE MMT MMT Right eval Left eval  Shoulder flexion    Shoulder extension    Shoulder abduction    Shoulder adduction    Shoulder extension    Shoulder internal rotation    Shoulder external rotation    Middle trapezius    Lower trapezius    Elbow flexion  Elbow extension    Wrist flexion    Wrist extension    Wrist ulnar deviation    Wrist radial deviation    Wrist pronation    Wrist supination    Grip strength     (Blank rows = not tested)  LOWER EXTREMITY SPECIAL TESTS:   SLR test negative Bilaterally Slump test negative Bilaterally  Ely's test Positive for left side  FUNCTIONAL TESTS:  5 times sit to stand: 11.03 sec  Timed up and go (TUG):    GAIT: Distance walked: 50 ft  Assistive device utilized: None Level of assistance: Complete Independence Comments: No gait observed                                                                                                                                 TREATMENT DATE:  [facility] Adult PT Treatment:                                                DATE: 07/26/24 Self Care:  -Pt education on interpreting current findings  -Discussed possible involvement of new onset of diabetes contributing to neuropathy        PATIENT EDUCATION:  Education details: POC and HEP Person educated: Patient Education method: Programmer, Multimedia, Demonstration, Tactile cues, Verbal cues, and Handouts Education comprehension: verbalized understanding, returned demonstration, verbal cues required, tactile cues required, and needs further  education  HOME EXERCISE PROGRAM:   ASSESSMENT:  CLINICAL IMPRESSION: Patient is a 43 y.o. male who was seen today for physical therapy evaluation and treatment for Right shoulder pain and left knee pain. Pt arrives upon referral from Dr Burnetta, and is hoping PT will resolve symptoms so he won't have to get injection. Upon assessment pt presents with decreased left knee ROM and functional capacity esp with stairs and walking. 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 Demonstrates mild strength deficits in lower extremity and negative results for neurodynamic testing. Pt wants to be able to work more efficiently, play basketball again, perform house hold chores without extreme difficulty. Pt will benefit from skilled therapy to address the deficits below and become a decreased fall risk.   OBJECTIVE IMPAIRMENTS: decreased activity tolerance, decreased coordination, decreased endurance, decreased mobility, decreased ROM, decreased strength, increased edema, increased fascial restrictions, increased muscle spasms, impaired flexibility, impaired UE functional use, improper body mechanics, postural dysfunction, and pain.   ACTIVITY LIMITATIONS: carrying, lifting, bending, sitting, standing, squatting, stairs, transfers, bathing, and reach over head  PARTICIPATION LIMITATIONS: meal prep, cleaning, laundry, driving, community activity, and occupation  PERSONAL FACTORS: Age, Fitness, Past/current experiences, Profession, Time since onset of injury/illness/exacerbation, and  1-2 comorbidities: Diabetes are also affecting patient's functional outcome.   REHAB POTENTIAL: Fair See above  CLINICAL DECISION MAKING: Evolving/moderate complexity  EVALUATION COMPLEXITY: Moderate   GOALS: Goals reviewed with patient? Yes  SHORT TERM GOALS: Target  date: 08/27/23 Pt will demonstrate competency with initial HEP so he has increased independence for everyday activities Baseline: Goal status: INITIAL  2.  Pt's right shoulder ROM and MMT testing will be completed for baseline information and LTG will be established.  Baseline: TBD Goal status: INITIAL  3.  Pt will demonstrate negative Ely's test on left leg so he can sit long periods of time at his job without decreasing functional capacity for car transfers Baseline: Positive Ely's test  Goal status: INITIAL    LONG TERM GOALS: Target date: 09/20/23  Pt will demonstrate competency with advanced HEP so he has increased independence for everyday activities Baseline:  Goal status: INITIAL  2.  Pt will score 10% or higher on LEFS so he can walk 2 blocks or more without difficulty in the left knee Baseline: 47% Goal status: INITIAL  3.  Pt will demonstrate increased left knee ROM, getting to 0 deg in knee extension so he has higher functional capacity, decreasing his risks for falls  Baseline: lacking 10 deg  Goal status: INITIAL 4. Goal established after right shoulder assessment TBD  Baseline: TBD  INITIAL:     PLAN:  PT FREQUENCY: 1-2x/week  PT DURATION: 8 weeks  PLANNED INTERVENTIONS: 97164- PT Re-evaluation, 97110-Therapeutic exercises, 97530- Therapeutic activity, 97112- Neuromuscular re-education, 97535- Self Care, [id number]- Manual therapy, [phone]- Electrical stimulation (manual), [phone] (1-2 muscles), [id number] (3+ muscles)- Dry Needling, Patient/Family education, Balance training, Stair training, Taping, Joint mobilization, Spinal mobilization, Cryotherapy, and Moist heat  PLAN FOR NEXT SESSION: Assess right shoulder ROM and MMT, quad strengthening and stretching    Lavanda Cleverly, Student-PT 07/26/2024, 5:34 PM

## 2024-08-01 ENCOUNTER — Ambulatory Visit: Payer: PRIVATE HEALTH INSURANCE | Admitting: Physical Therapy

## 2024-08-02 ENCOUNTER — Ambulatory Visit: Payer: PRIVATE HEALTH INSURANCE

## 2024-08-02 DIAGNOSIS — M25562 Pain in left knee: Secondary | ICD-10-CM

## 2024-08-02 DIAGNOSIS — M25511 Pain in right shoulder: Secondary | ICD-10-CM | POA: Diagnosis not present

## 2024-08-02 NOTE — Therapy (Addendum)
 OUTPATIENT PHYSICAL THERAPY LOWER EXTREMITY TREATMENT   Patient Name: Anthony Schmidt MRN: [id number] DOB:09/24/1980, 43 y.o., male Today's Date: 08/02/2024  END OF SESSION:  PT End of Session - 08/02/24 0800     Visit Number 2    Number of Visits 17    Date for Recertification  09/20/24    Authorization Type [employer]    PT Start Time 0800    PT Stop Time 0855    PT Time Calculation (min) 55 min    Activity Tolerance Patient tolerated treatment well    Behavior During Therapy [facility] for tasks assessed/performed         Past Medical History:  Diagnosis Date   Diabetes mellitus, new onset (HCC) 01/2020   Hyperlipidemia    Hypertension    Past Surgical History:  Procedure Laterality Date   KNEE ARTHROSCOPY Bilateral    KNEE SURGERY Bilateral    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   Type 2 diabetes mellitus with hyperglycemia, with long-term current use of insulin  (HCC) 08/04/2022   Syncope and collapse 04/07/2020   Alcohol intoxication with delirium    DKA (diabetic ketoacidosis) (HCC) 01/17/2020   Hyperkalemia 01/17/2020    PCP: Haze Arlington, NP  REFERRING PROVIDER: Lonell Sprang, DO  REFERRING DIAG:  Diagnosis  9034003551 (ICD-10-CM) - Chronic pain of left knee  M17.32 (ICD-10-CM) - Post-traumatic osteoarthritis of left knee  M25.511,G89.29 (ICD-10-CM) - Chronic right shoulder pain    THERAPY DIAG:  Right shoulder pain, unspecified chronicity  Left knee pain, unspecified chronicity  Rationale for Evaluation and Treatment: Rehabilitation  ONSET DATE: Left Knee: October 2024 Right shoulder - around Dec 2024  SUBJECTIVE:   SUBJECTIVE STATEMENT:  Patient reports he has pain and popping in shoulder when reaching overhead; states going up the stairs is painful and he has popping in knee. No change in pain levels since eval.  EVAL: Pt reports approx. 1 year ago he was playing basketball like  normal, started to notice unusual swelling and pain in left knee. He iced it and symptoms resolved for a time. However symptoms returned a few weeks later, where ice does not resolve the problem anymore. He checked in with his doctor, who examined him, preferring  he try PT first before getting an injection. States he constantly feels numbness from his left sided low back down into back of left knee. He came to therapy as referred by Dr Sprang, and wants to try and resolve left knee/right shoulder issues without having to get an injection.    PERTINENT HISTORY: Type 2 Diabetes DM new onset in 2021 Diabetic Ketoacidosis Syncope and collapse  Hyperkalemia  PAIN:  Are you having pain? Yes: NPRS scale: 10/10 left knee at worse, currently 6/10   Pain location: posterior knee cap, numbness starting in left sided low back radiating into left leg to posterior knee Pain description: sharp, dull ache,  Aggravating factors: sitting for 2 hours or more,  Relieving factors: ice pack, ice  PRECAUTIONS: None  RED FLAGS: None   WEIGHT BEARING RESTRICTIONS: No  FALLS:  Has patient fallen in last 6 months? Yes. 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  DIAGNOSTIC FINDINGS:  Left knee: 4 view x-ray of the left knee including bilateral standing AP, Rosenberg,  left leg lateral and sunrise view was ordered and reviewed by myself  today.  X-rays demonstrate tibial tunnel with hardware  from likely  previous ACL graft/reconstruction present in both knees.  There is at  least mild tibiofemoral joint space narrowing with osteophytosis and  spurring.  There is moderate patellofemoral narrowing and superior  enthesophyte spurs.   Right shoulder: Normal internal and external rotation humerus position.  Glenohumeral  joint space appears normal.  Acromiohumeral distance is normal.  AC joint  appears normal. No significant appearing degenerative changes . No  definite effusions or abnormal calcifications seen.    PATIENT SURVEYS:  LEFS  Extreme difficulty/unable (0), Quite a bit of difficulty (1), Moderate difficulty (2), Little difficulty (3), No difficulty (4) Survey date:  07/26/24  Any of your usual work, housework or school activities 2  2. Usual hobbies, recreational or sporting activities 0  3. Getting into/out of the bath 4  4. Walking between rooms 4  5. Putting on socks/shoes 4  6. Squatting  1  7. Lifting an object, like a bag of groceries from the floor 4  8. Performing light activities around your home 2  9. Performing heavy activities around your home 1  10. Getting into/out of a car 4  11. Walking 2 blocks 2  12. Walking 1 mile 1  13. Going up/down 10 stairs (1 flight) 1  14. Standing for 1 hour 1  15.  sitting for 1 hour 0 falls  16. Running on even ground 1  17. Running on uneven ground 1  18. Making sharp turns while running fast 1  19. Hopping  0  20. Rolling over in bed 4  Score total:  38/80   LEFS: 47%  COGNITION: Overall cognitive status: Within functional limits for tasks assessed     SENSATION: Not tested  EDEMA:  No edema present   MUSCLE LENGTH: Negative Thomas test bilaterally  Positive Ely's test on left leg  POSTURE: rounded shoulders and forward head   LOWER EXTREMITY ROM:  Active ROM Right eval Left eval  Hip flexion  WFL  Hip extension  [facility]  Hip abduction    Hip adduction    Hip internal rotation  WFL  Hip  external rotation  [facility]  Knee flexion  111 A , 122 passive   Knee extension  Lacking 10 deg  Ankle dorsiflexion    Ankle plantarflexion    Ankle inversion    Ankle eversion     (Blank rows = not tested)  LOWER EXTREMITY MMT:  MMT Right eval Left eval  Hip flexion 4- 4  Hip extension 4+ 4+  Hip abduction 4 4  Hip adduction    Hip internal rotation 4 4-  Hip external rotation 4+ 4+  Knee flexion 4+ 4+  Knee extension 5 5  Ankle dorsiflexion 4+ 4+  Ankle plantarflexion    Ankle inversion    Ankle eversion     (Blank rows = not tested) UPPER EXTREMITY MMT:  NO time to test UE MMT MMT Right eval Left eval  Shoulder flexion    Shoulder extension    Shoulder abduction    Shoulder  adduction    Shoulder extension    Shoulder internal rotation    Shoulder external rotation    Middle trapezius    Lower trapezius    Elbow flexion    Elbow extension    Wrist flexion    Wrist extension    Wrist ulnar deviation    Wrist radial deviation    Wrist pronation    Wrist supination    Grip strength     (Blank rows = not tested)  LOWER EXTREMITY SPECIAL TESTS:   SLR test negative Bilaterally Slump test negative Bilaterally  Ely's test Positive for left side  FUNCTIONAL TESTS:  5 times sit to stand: 11.03 sec  Timed up and go (TUG):    GAIT: Distance walked: 50 ft  Assistive device utilized: None Level of assistance: Complete Independence Comments: No gait observed                                                                                                                                 TREATMENT DATE:   [facility] Adult PT Treatment:                                                DATE: 08/02/2024 Neuromuscular re-ed: Seated: Scapula retraction 10 x 10 sec Shoulder ER + scap retract --> added yellow TB  Supine:  (Lt) quad set + towel roll underneath knee 10 x 5 sec Small range SLR + TA activation 10 x 5 sec Seated: Hip add isometric bolster squeeze 10 x 5 sec Lt  LAQ + hip add isometric bolster squeeze x10 Straight leg raise 10 x 5 sec Side lying resisted clamshells + green TB 10 x 3 sec Therapeutic Activity: Lt cervical lateral flexion + MWM Rt upper trap Doorway pec stretch --> discontinued due to pain Supine on 1/2 foam roller for pec stretch --> didn't feel much Thoracic extension supine over soft foam roller --> hands behind head (cue for lumbar compensation) Side lying: Shoulder abd + tactile cues for scapula stabilization Shoulder flexion to 90 degrees + tactile cues for scapula stabilization Elbow circles + tactile cues for scapula mobility  Self Care: Towel roll for postural support when driving (various positions)   [facility] Adult PT Treatment:                                                DATE: 07/26/24 Self Care:  -Pt education on interpreting current findings  -Discussed possible involvement of new onset of diabetes contributing to neuropathy and to discuss this further with MD     PATIENT EDUCATION:  Education details: Issued HEP Person educated: Patient Education method: Explanation, Demonstration, Actor  cues, Verbal cues, and Handouts Education comprehension: verbalized understanding, returned demonstration, verbal cues required, tactile cues required, and needs further education  HOME EXERCISE PROGRAM: Access Code: [email or web address] URL: [email or web address] Date: 08/02/2024 Prepared by: Lamarr Price  Exercises - Thoracic Extension Mobilization with Noodle  - 1 x daily - 7 x weekly - 3 sets - 10 reps - Supine Thoracic Mobilization Towel Roll Vertical with Arm Stretch  - 1 x daily - 7 x weekly - 3 sets - 10 reps - Seated Scapular Retraction  - 1 x daily - 7 x weekly - 3 sets - 10 reps - Sidelying Shoulder Flexion 15 Degrees  - 1 x daily - 7 x weekly - 3 sets - 10 reps - Supine Quad Set  - 1 x daily - 7 x weekly - 3 sets - 10 reps - Supine Active Straight Leg Raise  - 1 x daily - 7 x weekly - 3 sets - 10 reps - 5  sec hold - Seated Straight Leg Raise   - 1 x daily - 7 x weekly - 3 sets - 10 reps - 5 sec hold - Clamshell with Resistance  - 1 x daily - 7 x weekly - 3 sets - 10 reps - 5 sec  hold  ASSESSMENT:  CLINICAL IMPRESSION: Poor scapula stabilization demonstrated with arm raises; tactile cues for scapula stabilization and to promote scapulohumeral rhythm mechanics improved shoulder ROM and alleviated pain/popping. Thoracic extension stretches and mobility exercises also improved shoulder pain/discomfort. Provided recommendations for postural support during driving shifts with work. Decreased total knee extension ROM demonstrated on Rt LE; incorporated quad sets, progressing to straight leg raises with slight improvement in knee extension range. Cueing improved core stabilization, focusing on transverse abdominal activation and ribcage stabilization.   EVAL: Patient is a 43 y.o. male who was seen today for physical therapy evaluation and treatment for Right shoulder pain and left knee pain. Pt arrives upon referral from Dr Burnetta, and is hoping PT will resolve symptoms so he won't have to get injection. Upon assessment pt presents with decreased left knee ROM and functional capacity esp with stairs and walking. 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 Demonstrates mild strength deficits in lower extremity and negative results for neurodynamic testing. Pt wants to be able to work more efficiently, play basketball again, perform house hold chores without extreme difficulty. Pt will benefit from skilled therapy to address the deficits below and become a decreased fall risk.   OBJECTIVE IMPAIRMENTS: decreased activity tolerance, decreased coordination, decreased endurance, decreased mobility, decreased ROM, decreased strength, increased edema, increased  fascial restrictions, increased muscle spasms, impaired flexibility, impaired UE functional use, improper body mechanics, postural dysfunction, and pain.   ACTIVITY LIMITATIONS: carrying, lifting, bending, sitting, standing, squatting, stairs, transfers, bathing, and reach over head  PARTICIPATION LIMITATIONS: meal prep, cleaning, laundry, driving, community activity, and occupation  PERSONAL FACTORS: Age, Fitness, Past/current experiences, Profession, Time since onset of injury/illness/exacerbation, and 1-2 comorbidities: Diabetes are also affecting patient's functional outcome.   REHAB POTENTIAL: Fair See above  CLINICAL DECISION MAKING: Evolving/moderate complexity  EVALUATION COMPLEXITY: Moderate   GOALS: Goals reviewed with patient? Yes   SHORT TERM GOALS: Target date: 08/27/23  Pt will demonstrate competency with initial HEP so he has increased independence for everyday activities Baseline: Goal status: INITIAL  2.  Pt's right shoulder ROM and MMT testing will be completed for baseline information and LTG will be established.  Baseline: TBD Goal status: INITIAL  3.  Pt will report sitting in truck for >1 hour with no increase in pain Baseline:  Goal status: INITIAL    LONG TERM GOALS: Target date: 09/20/23  Pt will demonstrate competency with advanced HEP so he has increased independence for everyday activities Baseline:  Goal status: INITIAL  2.  Pt will score 10% or higher on LEFS so he can walk 2 blocks or more without difficulty in the left knee Baseline: 47% Goal status: INITIAL  3.  Pt will demonstrate increased left knee ROM, getting to 0 deg in knee extension so he has higher functional capacity, decreasing his risks for falls  Baseline: lacking 10 deg  Goal status: INITIAL  4. Goal established after right shoulder assessment TBD  Baseline: TBD  INITIAL:    PLAN:  PT FREQUENCY: 1-2x/week  PT DURATION: 8 weeks  PLANNED INTERVENTIONS: [id number]- PT  Re-evaluation, 97110-Therapeutic exercises, 97530- Therapeutic activity, 97112- Neuromuscular re-education, 97535- Self Care, [id number]- Manual therapy, [phone]- Electrical stimulation (manual), [phone] (1-2 muscles), [phone] (3+ muscles)- Dry Needling, Patient/Family education, Balance training, Stair training, Taping, Joint mobilization, Spinal mobilization, Cryotherapy, and Moist heat  PLAN FOR NEXT SESSION: Assess right shoulder ROM and MMT next visit, assess balance and SL STS, set appropriate goals, quad strengthening and stretching, thoracic mobility, scapula stabilization, core strengthening. Update HEP as needed.    Lamarr GORMAN Price, PTA 08/02/2024, 9:44 AM

## 2024-08-04 ENCOUNTER — Telehealth: Payer: Self-pay | Admitting: Nurse Practitioner

## 2024-08-04 ENCOUNTER — Encounter: Payer: Self-pay | Admitting: Physical Therapy

## 2024-08-04 ENCOUNTER — Other Ambulatory Visit: Payer: Self-pay

## 2024-08-04 ENCOUNTER — Ambulatory Visit: Payer: PRIVATE HEALTH INSURANCE | Admitting: Physical Therapy

## 2024-08-04 DIAGNOSIS — M25511 Pain in right shoulder: Secondary | ICD-10-CM | POA: Diagnosis not present

## 2024-08-04 DIAGNOSIS — M25562 Pain in left knee: Secondary | ICD-10-CM

## 2024-08-04 NOTE — Telephone Encounter (Signed)
 Form received, will fax once signed by provider.

## 2024-08-04 NOTE — Telephone Encounter (Signed)
 Copied from CRM #[id number]. Topic: Referral - Prior Authorization Question >> Aug 04, 2024  4:17 PM Shanda MATSU wrote:  Reason for CRM: [place] w/[facility] called in to see if auth req faxed on 07/28/2024 was recvd or if verbal auth for Omnipod 5 (insulin  pump) can be given, confirmed fax # where req was sent is correct, adv that I do not see this form was recvd, adv that I can send a message to clinical team to see if verbal shara can be given over the phone, TAT for call back would be EOB 08/07/2024.

## 2024-08-04 NOTE — Therapy (Signed)
 " OUTPATIENT PHYSICAL THERAPY LOWER EXTREMITY TREATMENT   Patient Name: Anthony Schmidt MRN: [id number] DOB:24-May-1981, 43 y.o., male Today's Date: 08/04/2024  END OF SESSION:  PT End of Session - 08/04/24 0942     Visit Number 3    Number of Visits 17    Date for Recertification  09/20/24    Authorization Type [employer]    PT Start Time 0845    PT Stop Time 0928    PT Time Calculation (min) 43 min    Activity Tolerance Patient tolerated treatment well    Behavior During Therapy [facility] for tasks assessed/performed          Past Medical History:  Diagnosis Date   Diabetes mellitus, new onset (HCC) 01/2020   Hyperlipidemia    Hypertension    Past Surgical History:  Procedure Laterality Date   KNEE ARTHROSCOPY Bilateral    KNEE SURGERY Bilateral    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   Type 2 diabetes mellitus with hyperglycemia, with long-term current use of insulin  (HCC) 08/04/2022   Syncope and collapse 04/07/2020   Alcohol intoxication with delirium    DKA (diabetic ketoacidosis) (HCC) 01/17/2020   Hyperkalemia 01/17/2020    PCP: Haze Arlington, NP  REFERRING PROVIDER: Lonell Sprang, DO  REFERRING DIAG:  Diagnosis  [phone] (ICD-10-CM) - Chronic pain of left knee  M17.32 (ICD-10-CM) - Post-traumatic osteoarthritis of left knee  M25.511,G89.29 (ICD-10-CM) - Chronic right shoulder pain    THERAPY DIAG:  Right shoulder pain, unspecified chronicity  Left knee pain, unspecified chronicity  Rationale for Evaluation and Treatment: Rehabilitation  ONSET DATE: Left Knee: October 2024 Right shoulder - around Dec 2024  SUBJECTIVE:   SUBJECTIVE STATEMENT:  Patient reports he has pain and popping in shoulder when reaching overhead; states going up the stairs is painful and he has popping in knee. No change in pain levels since eval.  EVAL: Pt reports approx. 1 year ago he was playing basketball  like normal, started to notice unusual swelling and pain in left knee. He iced it and symptoms resolved for a time. However symptoms returned a few weeks later, where ice does not resolve the problem anymore. He checked in with his doctor, who examined him, preferring  he try PT first before getting an injection. States he constantly feels numbness from his left sided low back down into back of left knee. He came to therapy as referred by Dr Sprang, and wants to try and resolve left knee/right shoulder issues without having to get an injection.    PERTINENT HISTORY: Type 2 Diabetes DM new onset in 2021 Diabetic Ketoacidosis Syncope and collapse  Hyperkalemia  PAIN:  Are you having pain? Yes: NPRS scale: 10/10 left knee at worse, currently 6/10   Pain location: posterior knee cap, numbness starting in left sided low back radiating into left leg to posterior knee Pain description: sharp, dull ache,  Aggravating factors: sitting for 2 hours or more,  Relieving factors: ice pack, ice  PRECAUTIONS: None  RED FLAGS: None   WEIGHT BEARING RESTRICTIONS: No  FALLS:  Has patient fallen in last 6 months? Yes. 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  DIAGNOSTIC FINDINGS:  Left knee: 4 view x-ray of the left knee including bilateral standing AP, Rosenberg,  left leg lateral and sunrise view was ordered and reviewed by myself  today.  X-rays demonstrate tibial tunnel with  hardware from likely  previous ACL graft/reconstruction present in both knees.  There is at  least mild tibiofemoral joint space narrowing with osteophytosis and  spurring.  There is moderate patellofemoral narrowing and superior  enthesophyte spurs.   Right shoulder: Normal internal and external rotation humerus position.  Glenohumeral  joint space appears normal.  Acromiohumeral distance is normal.  AC joint  appears normal. No significant appearing degenerative changes . No  definite effusions or abnormal calcifications seen.    PATIENT SURVEYS:  LEFS  Extreme difficulty/unable (0), Quite a bit of difficulty (1), Moderate difficulty (2), Little difficulty (3), No difficulty (4) Survey date:  07/26/24  Any of your usual work, housework or school activities 2  2. Usual hobbies, recreational or sporting activities 0  3. Getting into/out of the bath 4  4. Walking between rooms 4  5. Putting on socks/shoes 4  6. Squatting  1  7. Lifting an object, like a bag of groceries from the floor 4  8. Performing light activities around your home 2  9. Performing heavy activities around your home 1  10. Getting into/out of a car 4  11. Walking 2 blocks 2  12. Walking 1 mile 1  13. Going up/down 10 stairs (1 flight) 1  14. Standing for 1 hour 1  15.  sitting for 1 hour 0 falls  16. Running on even ground 1  17. Running on uneven ground 1  18. Making sharp turns while running fast 1  19. Hopping  0  20. Rolling over in bed 4  Score total:  38/80   LEFS: 47%  COGNITION: Overall cognitive status: Within functional limits for tasks assessed     SENSATION: Not tested  EDEMA:  No edema present   MUSCLE LENGTH: Negative Thomas test bilaterally  Positive Ely's test on left leg  POSTURE: rounded shoulders and forward head   LOWER EXTREMITY ROM:  Active ROM Right eval Left eval  Hip flexion  WFL  Hip extension  [facility]  Hip abduction    Hip adduction    Hip internal rotation  WFL   Hip external rotation  [facility]  Knee flexion  111 A , 122 passive   Knee extension  Lacking 10 deg  Ankle dorsiflexion    Ankle plantarflexion    Ankle inversion    Ankle eversion     (Blank rows = not tested)  LOWER EXTREMITY MMT:  MMT Right eval Left eval  Hip flexion 4- 4  Hip extension 4+ 4+  Hip abduction 4 4  Hip adduction    Hip internal rotation 4 4-  Hip external rotation 4+ 4+  Knee flexion 4+ 4+  Knee extension 5 5  Ankle dorsiflexion 4+ 4+  Ankle plantarflexion    Ankle inversion    Ankle eversion     (Blank rows = not tested) UPPER EXTREMITY MMT:  NO time to test UE MMT on eval MMT Right 08/04/24 Left 08/04/24  Shoulder flexion 5- slight pain 5  Shoulder extension  Shoulder abduction 5- 5  Shoulder adduction    Shoulder extension    Shoulder internal rotation 5 5  Shoulder external rotation 4 pain 5-  Middle trapezius    Lower trapezius    Elbow flexion    Elbow extension    Wrist flexion    Wrist extension    Wrist ulnar deviation    Wrist radial deviation    Wrist pronation    Wrist supination    Grip strength     (Blank rows = not tested)  LOWER EXTREMITY SPECIAL TESTS:   SLR test negative Bilaterally Slump test negative Bilaterally  Ely's test Positive for left side  FUNCTIONAL TESTS:  5 times sit to stand: 11.03 sec  Timed up and go (TUG):    GAIT: Distance walked: 50 ft  Assistive device utilized: None Level of assistance: Complete Independence Comments: No gait observed   Palpation 08/04/24:  TTP Rt lateral shoulder, increased pain with resisted flexion and ER                                                                                                                               TREATMENT DATE:   [facility] Adult PT Treatment:                                                DATE: 08/04/24 Therapeutic Exercise: MMT (see above) Palpation assessed (see above)  Neuromuscular re-ed: Sidelying ER 0# x 10 --> 3# x 10 Sidelying  clam green TB 3 x 10 Lt LE Supine serratus punch 3# x 10 --> 5# x 10 Standing Serratus wall slide red TB x 10 Scap clock red TB x 10 Seated SLR with quad set x 10 SLR with ER - difficult! SLR with lifting over yoga block x 10 Therapeutic Activity: Hip 3 way with slider focus on Lt LE stance  x 5 Diver to chair Lt LE stance - slight pain   [facility] Adult PT Treatment:                                                DATE: 08/02/2024 Neuromuscular re-ed: Seated: Scapula retraction 10 x 10 sec Shoulder ER + scap retract --> added yellow TB  Supine:  (Lt) quad set + towel roll underneath knee 10 x 5 sec Small range SLR + TA activation 10 x 5 sec Seated: Hip add isometric bolster squeeze 10 x 5 sec Lt LAQ + hip add isometric bolster squeeze x10 Straight leg raise 10 x 5 sec Side lying resisted clamshells + green TB 10 x 3 sec Therapeutic Activity: Lt cervical lateral flexion + MWM Rt upper trap Doorway pec stretch -->  discontinued due to pain Supine on 1/2 foam roller for pec stretch --> didn't feel much Thoracic extension supine over soft foam roller --> hands behind head (cue for lumbar compensation) Side lying: Shoulder abd + tactile cues for scapula stabilization Shoulder flexion to 90 degrees + tactile cues for scapula stabilization Elbow circles + tactile cues for scapula mobility  Self Care: Towel roll for postural support when driving (various positions)   [facility] Adult PT Treatment:                                                DATE: 07/26/24 Self Care:  -Pt education on interpreting current findings  -Discussed possible involvement of new onset of diabetes contributing to neuropathy and to discuss this further with MD     PATIENT EDUCATION:  Education details: Issued HEP Person educated: Patient Education method: Explanation, Demonstration, Tactile cues, Verbal cues, and Handouts Education comprehension: verbalized understanding, returned demonstration, verbal cues  required, tactile cues required, and needs further education  HOME EXERCISE PROGRAM: Access Code: [email or web address] URL: [email or web address] Date: 08/02/2024 Prepared by: Lamarr Price  Exercises - Thoracic Extension Mobilization with Noodle  - 1 x daily - 7 x weekly - 3 sets - 10 reps - Supine Thoracic Mobilization Towel Roll Vertical with Arm Stretch  - 1 x daily - 7 x weekly - 3 sets - 10 reps - Seated Scapular Retraction  - 1 x daily - 7 x weekly - 3 sets - 10 reps - Sidelying Shoulder Flexion 15 Degrees  - 1 x daily - 7 x weekly - 3 sets - 10 reps - Supine Quad Set  - 1 x daily - 7 x weekly - 3 sets - 10 reps - Supine Active Straight Leg Raise  - 1 x daily - 7 x weekly - 3 sets - 10 reps - 5 sec hold - Seated Straight Leg Raise   - 1 x daily - 7 x weekly - 3 sets - 10 reps - 5 sec hold - Clamshell with Resistance  - 1 x daily - 7 x weekly - 3 sets - 10 reps - 5 sec  hold  ASSESSMENT:  CLINICAL IMPRESSION: Assessment of Rt shoulder performed and new goals set. Pt with pain with resisted ER and flexion, TTP lateral and superior shoulder. Added serratus strengthening with good tolerance. HEP updated. Pt continues with knee discomfort with quad set and with ascending stairs. Some weakness noted with SLR with ER - added to HEP. PT advised pt to hold off of heavy wt lifting at this time until pain decreases  EVAL: Patient is a 43 y.o. male who was seen today for physical therapy evaluation and treatment for Right shoulder pain and left knee pain. Pt arrives upon referral from Dr Burnetta, and is hoping PT will resolve symptoms so he won't have to get injection. Upon assessment pt presents with decreased left knee ROM and functional capacity esp with stairs and walking. 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 Demonstrates mild  strength deficits in lower extremity and negative results for neurodynamic testing. Pt wants to be able to work more efficiently, play basketball again, perform house hold chores without  extreme difficulty. Pt will benefit from skilled therapy to address the deficits below and become a decreased fall risk.   OBJECTIVE IMPAIRMENTS: decreased activity tolerance, decreased coordination, decreased endurance, decreased mobility, decreased ROM, decreased strength, increased edema, increased fascial restrictions, increased muscle spasms, impaired flexibility, impaired UE functional use, improper body mechanics, postural dysfunction, and pain.      GOALS: Goals reviewed with patient? Yes   SHORT TERM GOALS: Target date: 08/27/23  Pt will demonstrate competency with initial HEP so he has increased independence for everyday activities Baseline: Goal status: INITIAL  2.  Pt's right shoulder ROM and MMT testing will be completed for baseline information and LTG will be established.  Baseline: TBD Goal status: MET  3.  Pt will report sitting in truck for >1 hour with no increase in pain Baseline:  Goal status: INITIAL    LONG TERM GOALS: Target date: 09/20/23  Pt will demonstrate competency with advanced HEP so he has increased independence for everyday activities Baseline:  Goal status: INITIAL  2.  Pt will score 10% or higher on LEFS so he can walk 2 blocks or more without difficulty in the left knee Baseline: 47% Goal status: INITIAL  3.  Pt will demonstrate increased left knee ROM, getting to 0 deg in knee extension so he has higher functional capacity, decreasing his risks for falls  Baseline: lacking 10 deg  Goal status: INITIAL  4. Pt will return to full weight lifting routine with pain <= 1/10  Baseline: TBD  Goal status: INITIAL 5.  Pt will demo >= 5-/5 Rt ER strength to perform work with decreased pain Baseline:  Goal status: INITIAL     PLAN:  PT FREQUENCY:  1-2x/week  PT DURATION: 8 weeks  PLANNED INTERVENTIONS: 97164- PT Re-evaluation, 97110-Therapeutic exercises, 97530- Therapeutic activity, [id number]- Neuromuscular re-education, 97535- Self Care, [id number]- Manual therapy, [id number]- Electrical stimulation (manual), 20560 (1-2 muscles), 20561 (3+ muscles)- Dry Needling, Patient/Family education, Balance training, Stair training, Taping, Joint mobilization, Spinal mobilization, Cryotherapy, and Moist heat  PLAN FOR NEXT SESSION: quad strengthening and stretching, thoracic mobility, scapula stabilization, core strengthening. Update HEP as needed.    Diedra Sinor, PT 08/04/2024, 9:43 AM  "

## 2024-08-08 ENCOUNTER — Ambulatory Visit: Payer: PRIVATE HEALTH INSURANCE | Admitting: Physical Therapy

## 2024-08-16 ENCOUNTER — Ambulatory Visit: Payer: PRIVATE HEALTH INSURANCE | Admitting: Physical Therapy

## 2024-08-16 ENCOUNTER — Encounter: Payer: Self-pay | Admitting: Physical Therapy

## 2024-08-16 DIAGNOSIS — M25511 Pain in right shoulder: Secondary | ICD-10-CM | POA: Diagnosis not present

## 2024-08-16 DIAGNOSIS — M25562 Pain in left knee: Secondary | ICD-10-CM

## 2024-08-16 NOTE — Therapy (Signed)
 " OUTPATIENT PHYSICAL THERAPY LOWER EXTREMITY TREATMENT   Patient Name: Anthony Schmidt MRN: [id number] DOB:08-16-1981, 43 y.o., male Today's Date: 08/16/2024  END OF SESSION:  PT End of Session - 08/16/24 1232     Visit Number 4    Number of Visits 17    Date for Recertification  09/20/24    Authorization Type [employer]    PT Start Time 1150    PT Stop Time 1230    PT Time Calculation (min) 40 min    Activity Tolerance Patient tolerated treatment well    Behavior During Therapy [facility] for tasks assessed/performed           Past Medical History:  Diagnosis Date   Diabetes mellitus, new onset (HCC) 01/2020   Hyperlipidemia    Hypertension    Past Surgical History:  Procedure Laterality Date   KNEE ARTHROSCOPY Bilateral    KNEE SURGERY Bilateral    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   Type 2 diabetes mellitus with hyperglycemia, with long-term current use of insulin  (HCC) 08/04/2022   Syncope and collapse 04/07/2020   Alcohol intoxication with delirium    DKA (diabetic ketoacidosis) (HCC) 01/17/2020   Hyperkalemia 01/17/2020    PCP: Haze Arlington, NP  REFERRING PROVIDER: Lonell Sprang, DO  REFERRING DIAG:  Diagnosis  [phone] (ICD-10-CM) - Chronic pain of left knee  M17.32 (ICD-10-CM) - Post-traumatic osteoarthritis of left knee  M25.511,G89.29 (ICD-10-CM) - Chronic right shoulder pain    THERAPY DIAG:  Right shoulder pain, unspecified chronicity  Left knee pain, unspecified chronicity  Rationale for Evaluation and Treatment: Rehabilitation  ONSET DATE: Left Knee: October 2024 Right shoulder - around Dec 2024  SUBJECTIVE:   SUBJECTIVE STATEMENT:  Patient reports he continues with knee pain with standing and walking, shoulder hurts worse at night. He has not been working out but has been working.  EVAL: Pt reports approx. 1 year ago he was playing basketball like normal, started to  notice unusual swelling and pain in left knee. He iced it and symptoms resolved for a time. However symptoms returned a few weeks later, where ice does not resolve the problem anymore. He checked in with his doctor, who examined him, preferring  he try PT first before getting an injection. States he constantly feels numbness from his left sided low back down into back of left knee. He came to therapy as referred by Dr Sprang, and wants to try and resolve left knee/right shoulder issues without having to get an injection.    PERTINENT HISTORY: Type 2 Diabetes DM new onset in 2021 Diabetic Ketoacidosis Syncope and collapse  Hyperkalemia  PAIN:  Are you having pain? Yes: NPRS scale: 10/10 left knee at worse, currently 8/10   Pain location: posterior knee cap, numbness starting in left sided low back radiating into left leg to posterior knee Pain description: sharp, dull ache,  Aggravating factors: sitting for 2 hours or more,  Relieving factors: ice pack, ice  PRECAUTIONS: None  RED FLAGS: None   WEIGHT BEARING RESTRICTIONS: No  FALLS:  Has patient fallen in last 6 months? Yes. 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  DIAGNOSTIC FINDINGS:  Left knee: 4 view x-ray of the left knee including bilateral standing AP, Rosenberg,  left leg lateral and sunrise view was ordered and reviewed by myself  today.  X-rays demonstrate tibial tunnel with hardware from likely  previous  ACL graft/reconstruction present in both knees.  There is at  least mild tibiofemoral joint space narrowing with osteophytosis and  spurring.  There is moderate patellofemoral narrowing and superior  enthesophyte spurs.   Right shoulder: Normal internal and external rotation humerus position.  Glenohumeral  joint space appears normal.  Acromiohumeral distance is normal.  AC joint  appears normal. No significant appearing degenerative changes . No  definite effusions or abnormal calcifications seen.    PATIENT SURVEYS:  LEFS  Extreme difficulty/unable (0), Quite a bit of difficulty (1), Moderate difficulty (2), Little difficulty (3), No difficulty (4) Survey date:  07/26/24  Any of your usual work, housework or school activities 2  2. Usual hobbies, recreational or sporting activities 0  3. Getting into/out of the bath 4  4. Walking between rooms 4  5. Putting on socks/shoes 4  6. Squatting  1  7. Lifting an object, like a bag of groceries from the floor 4  8. Performing light activities around your home 2  9. Performing heavy activities around your home 1  10. Getting into/out of a car 4  11. Walking 2 blocks 2  12. Walking 1 mile 1  13. Going up/down 10 stairs (1 flight) 1  14. Standing for 1 hour 1  15.  sitting for 1 hour 0 falls  16. Running on even ground 1  17. Running on uneven ground 1  18. Making sharp turns while running fast 1  19. Hopping  0  20. Rolling over in bed 4  Score total:  38/80   LEFS: 47%  COGNITION: Overall cognitive status: Within functional limits for tasks assessed     SENSATION: Not tested  EDEMA:  No edema present   MUSCLE LENGTH: Negative Thomas test bilaterally  Positive Ely's test on left leg  POSTURE: rounded shoulders and forward head   LOWER EXTREMITY ROM:  Active ROM Right eval Left eval  Hip flexion  WFL  Hip extension  [facility]  Hip abduction    Hip adduction    Hip internal rotation  WFL  Hip external rotation  [facility]   Knee flexion  111 A , 122 passive   Knee extension  Lacking 10 deg  Ankle dorsiflexion    Ankle plantarflexion    Ankle inversion    Ankle eversion     (Blank rows = not tested)  LOWER EXTREMITY MMT:  MMT Right eval Left eval  Hip flexion 4- 4  Hip extension 4+ 4+  Hip abduction 4 4  Hip adduction    Hip internal rotation 4 4-  Hip external rotation 4+ 4+  Knee flexion 4+ 4+  Knee extension 5 5  Ankle dorsiflexion 4+ 4+  Ankle plantarflexion    Ankle inversion    Ankle eversion     (Blank rows = not tested) UPPER EXTREMITY MMT:  NO time to test UE MMT on eval MMT Right 08/04/24 Left 08/04/24  Shoulder flexion 5- slight pain 5  Shoulder extension    Shoulder abduction 5- 5  Shoulder adduction    Shoulder extension    Shoulder internal rotation 5 5  Shoulder external rotation 4 pain 5-  Middle trapezius    Lower trapezius    Elbow flexion    Elbow extension    Wrist flexion    Wrist extension    Wrist ulnar deviation    Wrist radial deviation    Wrist pronation    Wrist supination    Grip strength     (Blank rows = not tested)  LOWER EXTREMITY SPECIAL TESTS:   SLR test negative Bilaterally Slump test negative Bilaterally  Ely's test Positive for left side  FUNCTIONAL TESTS:  5 times sit to stand: 11.03 sec  Timed up and go (TUG):    GAIT: Distance walked: 50 ft  Assistive device utilized: None Level of assistance: Complete Independence Comments: No gait observed   Palpation 08/04/24:  TTP Rt lateral shoulder, increased pain with resisted flexion and ER                                                                                                                               TREATMENT DATE:   [facility] Adult PT Treatment:                                                DATE: 08/16/24  Manual Therapy: Inferior GH jt mobs in scapular plane to improve abduction and flexion tolerance Neuromuscular re-ed: Long sitting SLR --> supine SLR LT LE Long  sitting SLR with ER --> supine SLR with ER - difficult Attempt prone T - unable due to Rt shld pain Prone row 10# x 25 Rt UE Prone shoulder ext 7# x 25 Rt UE Therapeutic Activity: Supine bottoms up 5# KB serratus punch 2 x 10 SL bridge on LT - 2 x 3 - limited endurance requiring frequent rests SL stand to sit Lt LE - difficult!   [facility] Adult PT Treatment:                                                DATE: 08/04/24 Therapeutic Exercise: MMT (see above) Palpation assessed (see above)  Neuromuscular re-ed: Sidelying ER 0# x 10 --> 3# x 10 Sidelying clam green TB 3 x 10 Lt LE Supine serratus punch 3# x 10 --> 5# x 10 Standing Serratus wall slide red TB x 10 Scap clock red TB x 10 Seated SLR with quad set x 10 SLR with ER - difficult! SLR with lifting over yoga block x 10 Therapeutic Activity: Hip 3 way with slider focus on Lt LE stance  x 5 Diver to chair Lt LE stance - slight pain   [facility]  Adult PT Treatment:                                                DATE: 08/02/2024 Neuromuscular re-ed: Seated: Scapula retraction 10 x 10 sec Shoulder ER + scap retract --> added yellow TB  Supine:  (Lt) quad set + towel roll underneath knee 10 x 5 sec Small range SLR + TA activation 10 x 5 sec Seated: Hip add isometric bolster squeeze 10 x 5 sec Lt LAQ + hip add isometric bolster squeeze x10 Straight leg raise 10 x 5 sec Side lying resisted clamshells + green TB 10 x 3 sec Therapeutic Activity: Lt cervical lateral flexion + MWM Rt upper trap Doorway pec stretch --> discontinued due to pain Supine on 1/2 foam roller for pec stretch --> didn't feel much Thoracic extension supine over soft foam roller --> hands behind head (cue for lumbar compensation) Side lying: Shoulder abd + tactile cues for scapula stabilization Shoulder flexion to 90 degrees + tactile cues for scapula stabilization Elbow circles + tactile cues for scapula mobility  Self Care: Towel roll for postural support  when driving (various positions)     PATIENT EDUCATION:  Education details: Issued HEP Person educated: Patient Education method: Explanation, Demonstration, Tactile cues, Verbal cues, and Handouts Education comprehension: verbalized understanding, returned demonstration, verbal cues required, tactile cues required, and needs further education  HOME EXERCISE PROGRAM: Access Code: [email or web address] URL: [email or web address] Date: 08/16/2024 Prepared by: Darice Conine  Exercises - Long Sitting Straight Leg Raise with External Rotation  - 1 x daily - 7 x weekly - 3 sets - 10 reps - Straight Leg Raise with Arm Support  - 1 x daily - 7 x weekly - 3 sets - 10 reps - Sidelying Hip Circles  - 1 x daily - 7 x weekly - 3 sets - 10 reps - Single Leg Bridge  - 1 x daily - 7 x weekly - 1 sets - 10 reps - Single Leg Sit to Stand with Arms Crossed  - 1 x daily - 7 x weekly - 1 sets - 10 reps - Shoulder Flexion Serratus Activation with Resistance  - 1 x daily - 7 x weekly - 3 sets - 10 reps - Wall Clock with Theraband  - 1 x daily - 7 x weekly - 3 sets - 10 reps - Bent Over Single Arm Shoulder Row with Dumbbell  - 1 x daily - 7 x weekly - 3 sets - 10 reps - Single Arm Bent Over Shoulder Extension with Dumbbell  - 1 x daily - 7 x weekly - 3 sets - 10 reps - Single Arm Serratus Punches in Supine with Dumbbell  - 1 x daily - 7 x weekly - 3 sets - 10 reps  ASSESSMENT:  CLINICAL IMPRESSION: Pt with more Lt knee pain today. Performed exercises without wt bearing and gave increased rest breaks due to increase in pain. Pt with significant strength deficits noted as he is unable to perform full SL bridge on Lt and has difficulty with SL stand to sit. HEP updated to continue to progress strength as tolerated  EVAL: Patient is a 43 y.o. male who was seen today for physical therapy evaluation and treatment for Right shoulder pain and left knee pain. Pt arrives upon referral from Dr Burnetta, and is  hoping PT will resolve  symptoms so he won't have to get injection. Upon assessment pt presents with decreased left knee ROM and functional capacity esp with stairs and walking. 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 Demonstrates mild strength deficits in lower extremity and negative results for neurodynamic testing. Pt wants to be able to work more efficiently, play basketball again, perform house hold chores without extreme difficulty. Pt will benefit from skilled therapy to address the deficits below and become a decreased fall risk.   OBJECTIVE IMPAIRMENTS: decreased activity tolerance, decreased coordination, decreased endurance, decreased mobility, decreased ROM, decreased strength, increased edema, increased fascial restrictions, increased muscle spasms, impaired flexibility, impaired UE functional use, improper body mechanics, postural dysfunction, and pain.      GOALS: Goals reviewed with patient? Yes   SHORT TERM GOALS: Target date: 08/27/23  Pt will demonstrate competency with initial HEP so he has increased independence for everyday activities Baseline: Goal status: INITIAL  2.  Pt's right shoulder ROM and MMT testing will be completed for baseline information and LTG will be established.  Baseline: TBD Goal status: MET  3.  Pt will report sitting in truck for >1 hour with no increase in pain Baseline:  Goal status: INITIAL    LONG TERM GOALS: Target date: 09/20/23  Pt will demonstrate competency with advanced HEP so he has increased independence for everyday activities Baseline:  Goal status: INITIAL  2.  Pt will score 10% or higher on LEFS so he can walk 2 blocks or more without difficulty in the left knee Baseline: 47% Goal status: INITIAL  3.  Pt will demonstrate increased left knee ROM, getting to 0 deg in  knee extension so he has higher functional capacity, decreasing his risks for falls  Baseline: lacking 10 deg  Goal status: INITIAL  4. Pt will return to full weight lifting routine with pain <= 1/10  Baseline: TBD  Goal status: INITIAL 5.  Pt will demo >= 5-/5 Rt ER strength to perform work with decreased pain Baseline:  Goal status: INITIAL     PLAN:  PT FREQUENCY: 1-2x/week  PT DURATION: 8 weeks  PLANNED INTERVENTIONS: 97164- PT Re-evaluation, 97110-Therapeutic exercises, 97530- Therapeutic activity, [id number]- Neuromuscular re-education, 97535- Self Care, [id number]- Manual therapy, [id number]- Electrical stimulation (manual), 20560 (1-2 muscles), 20561 (3+ muscles)- Dry Needling, Patient/Family education, Balance training, Stair training, Taping, Joint mobilization, Spinal mobilization, Cryotherapy, and Moist heat  PLAN FOR NEXT SESSION: quad strengthening and stretching, thoracic mobility, scapula stabilization, core strengthening. Update HEP as needed.    Kacy Conely, PT 08/16/2024, 12:33 PM  "

## 2024-08-22 ENCOUNTER — Telehealth: Payer: Self-pay | Admitting: Nurse Practitioner

## 2024-08-22 NOTE — Telephone Encounter (Signed)
 Copied from CRM [id number]. Topic: Clinical - Request for Lab/Test Order >> Aug 22, 2024  8:53 AM Revonda D wrote:  Reason for CRM: Pt is requesting lab orders for a glucose test and would like a callback to get the appt scheduled. Pt stated that its needed for his job and is due on 09/02/24.

## 2024-08-22 NOTE — Telephone Encounter (Signed)
Mychart message sent with reply.

## 2024-08-23 ENCOUNTER — Ambulatory Visit: Payer: PRIVATE HEALTH INSURANCE | Admitting: Physical Therapy

## 2024-08-29 ENCOUNTER — Ambulatory Visit: Payer: PRIVATE HEALTH INSURANCE | Admitting: Sports Medicine

## 2024-09-05 ENCOUNTER — Encounter: Payer: Self-pay | Admitting: Nurse Practitioner

## 2024-09-05 ENCOUNTER — Other Ambulatory Visit: Payer: Self-pay

## 2024-09-11 ENCOUNTER — Other Ambulatory Visit: Payer: Self-pay

## 2024-09-13 ENCOUNTER — Other Ambulatory Visit: Payer: Self-pay

## 2024-09-13 ENCOUNTER — Ambulatory Visit: Payer: PRIVATE HEALTH INSURANCE | Admitting: Sports Medicine

## 2024-09-13 ENCOUNTER — Encounter: Payer: Self-pay | Admitting: Sports Medicine

## 2024-09-13 DIAGNOSIS — M25511 Pain in right shoulder: Secondary | ICD-10-CM | POA: Diagnosis not present

## 2024-09-13 DIAGNOSIS — M2352 Chronic instability of knee, left knee: Secondary | ICD-10-CM | POA: Diagnosis not present

## 2024-09-13 DIAGNOSIS — M172 Bilateral post-traumatic osteoarthritis of knee: Secondary | ICD-10-CM

## 2024-09-13 DIAGNOSIS — G8929 Other chronic pain: Secondary | ICD-10-CM | POA: Diagnosis not present

## 2024-09-13 MED ORDER — MELOXICAM 15 MG PO TABS
15.0000 mg | ORAL_TABLET | Freq: Every day | ORAL | 1 refills | Status: AC
  Filled 2024-09-13: qty 30, 30d supply, fill #0

## 2024-09-13 MED ORDER — LIDOCAINE HCL 1 % IJ SOLN
2.0000 mL | INTRAMUSCULAR | Status: AC | PRN
  Administered 2024-09-13: 2 mL

## 2024-09-13 MED ORDER — METHYLPREDNISOLONE ACETATE 40 MG/ML IJ SUSP
40.0000 mg | INTRAMUSCULAR | Status: AC | PRN
  Administered 2024-09-13: 40 mg via INTRA_ARTICULAR

## 2024-09-13 MED ORDER — BUPIVACAINE HCL 0.25 % IJ SOLN
2.0000 mL | INTRAMUSCULAR | Status: AC | PRN
  Administered 2024-09-13: 2 mL via INTRA_ARTICULAR

## 2024-09-13 NOTE — Progress Notes (Signed)
 Patient says that he got complete relief of his knee pain for a day or so, but his pain did return to the same degree as it was prior to the injection. He did physical therapy twice weekly and does exercises on his own at home. He says that they feel okay until he feels soreness and popping in the knee. Patient has also been doing physical therapy for his right shoulder and has not noticed any change in his pain. He does exercises consistently on his own.

## 2024-09-13 NOTE — Progress Notes (Signed)
 "  Anthony Schmidt - 44 y.o. male MRN [id number]  Date of birth: 1981/05/18  Office Visit Note: Visit Date: 09/13/2024 PCP: Theotis Haze ORN, NP Referred by: Theotis Haze ORN, NP  Subjective: Chief Complaint  Patient presents with   Right Shoulder - Follow-up   Left Knee - Follow-up   HPI: Anthony Schmidt is a pleasant 44 y.o. male who presents today for f/u right shoulder, left knee.  He is a type-II diabetic - managed on Metformin  100mg  BID, has been on insulin  70/30 12 units BID in past.  Lab Results  Component Value Date   HGBA1C 8.1 (A) 06/13/2024   Discussed the use of AI scribe software for clinical note transcription with the patient, who gave verbal consent to proceed.  History of Present Illness Anthony Schmidt is a 44 year old male with prior left knee ACL reconstruction and bilateral knee osteoarthritis who presents with persistent left knee pain and swelling.  He has intermittent left knee pain and episodic effusions, sometimes severe enough to limit weight-bearing and range of motion, most recently last Thursday. He has ongoing soreness and difficulty achieving full flexion. Physical therapy has not improved symptoms. A steroid injection in December gave marked relief for only several days before pain and swelling recurred. He currently takes ibuprofen  about 1000 mg six to seven times daily for pain and swelling despite prior advice to reduce this, and also uses diclofenac , though the schedule is unclear. He had left ACL reconstruction in 1996 or 1997, graft source unknown.  He has chronic right shoulder pain with mechanical popping and clicking. Pain is consistently provoked by overhead activity, abduction, and resisted maneuvers and can radiate down the arm with a pulling quality. Physical therapy has not changed his symptoms. He cannot return to gym workouts but remains able to do activities of daily living and drive without difficulty.   Pertinent ROS were reviewed with the  patient and found to be negative unless otherwise specified above in HPI.   Assessment & Plan: Visit Diagnoses:  1. Chronic right shoulder pain   2. Post-traumatic osteoarthritis of both knees   3. Chronic instability of left knee    A/P Summary --> left knee with history of ACL reconstruction, likely with cadaver graft back in the 90s which has continued instability and some laxity on this left side compared to the right which is firm and intact.  He does have a moderate degree of osteoarthritic change as well which is likely contributing.  He does receive recurrent flares/effusions.  Will trial viscosupplementation injection.  Could consider MRI and referral to Dr. Genelle to assess integrity and possibly discuss surgical correction of the ACL, hold on this for now.  Right shoulder has signs and physical exam concerning for labral tearing, did proceed with ultrasound-guided glenohumeral joint injection and continued therapy.  Will see how he responds to the above.  Next step could be consideration of MRI arthrogram. NSAIDs as below. Assessment & Plan Chronic right shoulder pain with suspected labral tear Chronic right shoulder pain with mechanical symptoms and functional limitation, consistent with labral pathology. Labral tear with preserved bony anatomy and rotator cuff function. Conservative management with physical therapy ongoing. - Through shared decision making, did proceed with ultrasound-guided intra-articular shoulder injection, patient tolerated well.  Advised on postinjection protocol. - Instructed to avoid heavy lifting and shoulder rehabilitation exercises for today and tomorrow, continue activities of daily living as tolerated.  Note provided for work today - Advised resumption of therapy next  week, monitor for improvement in pain and function. - Discussed MRI-arthrogram as future diagnostic option if symptoms persist. - We will start him on meloxicam  15 mg once daily.  Advised not  to take ibuprofen  with this but may take Tylenol  as needed.  Post-traumatic osteoarthritis and instability of left knee Chronic post-traumatic osteoarthritis and instability following prior ACL reconstruction, with recurrent swelling, pain, and functional limitation. Increased laxity and instability compared to contralateral side. Previous steroid injection provided short-term relief. Excessive ibuprofen  use poses risk for complications. - [employer] authorization for left knee hyaluronic acid injection. - Advised discontinuation of high-dose ibuprofen , transition to scheduled oral Meloxicam  15mg  daily, avoid concurrent ibuprofen  use. - continuation of physical therapy for knee rehabilitation, could consider knee reaction knee brace in future - Discussed surgical intervention as future option if symptoms persist --> would consider Dr. Genelle first for integrity of ACL but may want knee MRI first  Bilateral knee osteoarthritis Bilateral knee osteoarthritis, left knee more symptomatic due to instability and post-traumatic changes, s/p ACL surgery years ago. - Mobic  15mg  every day - Continue PT  Follow-up: Return for Will call once approved for gel injection L-knee .   Meds & Orders:  Meds ordered this encounter  Medications   meloxicam  (MOBIC ) 15 MG tablet    Sig: Take 1 tablet (15 mg total) by mouth daily.    Dispense:  30 tablet    Refill:  1    Orders Placed This Encounter  Procedures   Large Joint Inj: R glenohumeral   US  Guided Needle Placement - No Linked Charges   Ambulatory request for injection medication     Procedures: Large Joint Inj: R glenohumeral on 09/13/2024 10:39 AM Indications: pain Details: 22 G 3.5 in needle, ultrasound-guided posterior approach Medications: 2 mL lidocaine  1 %; 2 mL bupivacaine  0.25 %; 40 mg methylPREDNISolone  acetate 40 MG/ML Outcome: tolerated well, no immediate complications  US -guided glenohumeral joint injection, right  shoulder After discussion on risks/benefits/indications, informed verbal consent was obtained. A timeout was then performed. The patient was positioned lying lateral recumbent on examination table. The patient's shoulder was prepped with betadine and multiple alcohol swabs and utilizing ultrasound guidance, the patient's glenohumeral joint was identified on ultrasound. Using ultrasound guidance a 22-gauge, 3.5 inch needle with a mixture of 2:2:1 cc's lidocaine :bupivicaine:depomedrol was directed from a lateral to medial direction via in-plane technique into the glenohumeral joint with visualization of appropriate spread of injectate into the joint. Patient tolerated the procedure well without immediate complications.      Procedure, treatment alternatives, risks and benefits explained, specific risks discussed. Consent was given by the patient. Immediately prior to procedure a time out was called to verify the correct patient, procedure, equipment, support staff and site/side marked as required. Patient was prepped and draped in the usual sterile fashion.          Clinical History: No specialty comments available.  He reports that he has never smoked. He has never been exposed to tobacco smoke. He has never used smokeless tobacco.  Recent Labs    11/16/23 1557 06/13/24 1359  HGBA1C 7.0* 8.1*    Objective:    Physical Exam  Gen: Well-appearing, in no acute distress; non-toxic CV: Well-perfused. Warm.  Resp: Breathing unlabored on room air; no wheezing. Psych: Fluid speech in conversation; appropriate affect; normal thought process  *[facility]/Ortho Exam: Physical Exam MUSCULOSKELETAL: No pain on palpation of shoulder. Pain on palpation of shoulder joint. Pain with shoulder abduction and adduction. Pain with  resisted shoulder flexion and external rotation. No pain with resisted shoulder internal rotation. Pain with shoulder extension. No pain with shoulder flexion. Pain with O'Brien's test.  Pull sensation with shoulder extension. Uncertain response with shoulder stabilization. Pain with labral grind test. Bony structure of shoulder normal. Reduced swelling in knee. Limited knee flexion and extension. Soreness with knee flexion. Increased laxity in left knee.  *Right shoulder: There is full active and passive range of motion although pain at end range flexion and abduction.  There is mild discomfort with resisted abduction and ER although no gross weakness.  Rotator cuff strength testing preserved and equal bilaterally.  Positive O'Brien's test, there is tenderness within the bicipital groove and anterior joint recess on palpation.  *Left knee: Trace effusion present.  Range of motion is limited from 5-120 degrees with pain at endrange flexion.  There is laxity noted with Lachman and anterior drawer testing on the left knee which is stable on the right knee.  There is a firm endpoint although certainly has more laxity and translation.  Imaging: No results found.  Past Medical/Family/Surgical/Social History: Medications & Allergies reviewed per EMR, new medications updated. 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   Type 2 diabetes mellitus with hyperglycemia, with long-term current use of insulin  (HCC) 08/04/2022   Syncope and collapse 04/07/2020   Alcohol intoxication with delirium    DKA (diabetic ketoacidosis) (HCC) 01/17/2020   Hyperkalemia 01/17/2020   Past Medical History:  Diagnosis Date   Diabetes mellitus, new onset (HCC) 01/2020   Hyperlipidemia    Hypertension    Family History  Problem Relation Age of Onset   Diabetes Mother    Hypertension Mother    Diabetes Father    Hypertension Father    Past Surgical History:  Procedure Laterality Date   KNEE ARTHROSCOPY Bilateral    KNEE SURGERY Bilateral    Social History   Occupational History   Not on file  Tobacco Use   Smoking status: Never    Passive  exposure: Never   Smokeless tobacco: Never  Vaping Use   Vaping status: Never Used  Substance and Sexual Activity   Alcohol use: Not Currently    Alcohol/week: 6.0 standard drinks of alcohol    Types: 6 Cans of beer per week    Comment: occassionally   Drug use: Never   Sexual activity: Yes   "

## 2024-09-19 ENCOUNTER — Other Ambulatory Visit: Payer: Self-pay | Admitting: Family Medicine

## 2024-09-19 DIAGNOSIS — Z794 Long term (current) use of insulin: Secondary | ICD-10-CM

## 2024-09-26 ENCOUNTER — Ambulatory Visit: Payer: PRIVATE HEALTH INSURANCE | Admitting: Nurse Practitioner
